# Patient Record
Sex: Female | Born: 1975 | Race: White | Hispanic: No | Marital: Married | Smoking: Current every day smoker
Health system: Southern US, Community
[De-identification: ages and names within clinical notes are randomized; demographics above are authoritative.]

## PROBLEM LIST (undated history)

## (undated) DIAGNOSIS — M51369 Other intervertebral disc degeneration, lumbar region without mention of lumbar back pain or lower extremity pain: Secondary | ICD-10-CM

## (undated) DIAGNOSIS — E282 Polycystic ovarian syndrome: Secondary | ICD-10-CM

## (undated) DIAGNOSIS — K589 Irritable bowel syndrome without diarrhea: Secondary | ICD-10-CM

## (undated) DIAGNOSIS — M419 Scoliosis, unspecified: Secondary | ICD-10-CM

## (undated) DIAGNOSIS — K760 Fatty (change of) liver, not elsewhere classified: Secondary | ICD-10-CM

## (undated) DIAGNOSIS — F419 Anxiety disorder, unspecified: Secondary | ICD-10-CM

## (undated) DIAGNOSIS — F609 Personality disorder, unspecified: Secondary | ICD-10-CM

## (undated) DIAGNOSIS — K219 Gastro-esophageal reflux disease without esophagitis: Secondary | ICD-10-CM

## (undated) DIAGNOSIS — R197 Diarrhea, unspecified: Secondary | ICD-10-CM

## (undated) DIAGNOSIS — R1013 Epigastric pain: Secondary | ICD-10-CM

## (undated) DIAGNOSIS — M5136 Other intervertebral disc degeneration, lumbar region: Secondary | ICD-10-CM

## (undated) HISTORY — DX: Personality disorder, unspecified: F60.9

## (undated) HISTORY — DX: Diarrhea, unspecified: R19.7

## (undated) HISTORY — DX: Fatty (change of) liver, not elsewhere classified: K76.0

## (undated) HISTORY — PX: INCISION AND DRAINAGE PERIRECTAL ABSCESS: SHX1804

## (undated) HISTORY — DX: Other intervertebral disc degeneration, lumbar region without mention of lumbar back pain or lower extremity pain: M51.369

## (undated) HISTORY — DX: Gastro-esophageal reflux disease without esophagitis: K21.9

## (undated) HISTORY — PX: DILATION AND CURETTAGE OF UTERUS: SHX78

## (undated) HISTORY — DX: Anxiety disorder, unspecified: F41.9

## (undated) HISTORY — DX: Irritable bowel syndrome, unspecified: K58.9

## (undated) HISTORY — DX: Scoliosis, unspecified: M41.9

## (undated) HISTORY — PX: CHOLECYSTECTOMY: SHX55

## (undated) HISTORY — DX: Other intervertebral disc degeneration, lumbar region: M51.36

## (undated) HISTORY — PX: CERVICAL BIOPSY  W/ LOOP ELECTRODE EXCISION: SUR135

## (undated) HISTORY — DX: Epigastric pain: R10.13

---

## 2002-05-07 ENCOUNTER — Emergency Department (HOSPITAL_COMMUNITY): Admission: EM | Admit: 2002-05-07 | Discharge: 2002-05-07 | Payer: Self-pay | Admitting: Emergency Medicine

## 2002-05-09 ENCOUNTER — Emergency Department (HOSPITAL_COMMUNITY): Admission: EM | Admit: 2002-05-09 | Discharge: 2002-05-09 | Payer: Self-pay | Admitting: Emergency Medicine

## 2002-05-31 ENCOUNTER — Emergency Department (HOSPITAL_COMMUNITY): Admission: EM | Admit: 2002-05-31 | Discharge: 2002-05-31 | Payer: Self-pay | Admitting: Emergency Medicine

## 2002-08-01 ENCOUNTER — Emergency Department (HOSPITAL_COMMUNITY): Admission: EM | Admit: 2002-08-01 | Discharge: 2002-08-01 | Payer: Self-pay | Admitting: Emergency Medicine

## 2002-09-01 ENCOUNTER — Emergency Department (HOSPITAL_COMMUNITY): Admission: EM | Admit: 2002-09-01 | Discharge: 2002-09-01 | Payer: Self-pay | Admitting: *Deleted

## 2002-09-25 ENCOUNTER — Encounter: Admission: RE | Admit: 2002-09-25 | Discharge: 2002-09-25 | Payer: Self-pay | Admitting: Infectious Diseases

## 2002-10-01 ENCOUNTER — Encounter: Admission: RE | Admit: 2002-10-01 | Discharge: 2002-10-01 | Payer: Self-pay | Admitting: Infectious Diseases

## 2003-01-23 ENCOUNTER — Emergency Department (HOSPITAL_COMMUNITY): Admission: EM | Admit: 2003-01-23 | Discharge: 2003-01-23 | Payer: Self-pay | Admitting: Emergency Medicine

## 2006-03-11 ENCOUNTER — Emergency Department: Payer: Self-pay | Admitting: Emergency Medicine

## 2006-04-18 ENCOUNTER — Emergency Department (HOSPITAL_COMMUNITY): Admission: EM | Admit: 2006-04-18 | Discharge: 2006-04-18 | Payer: Self-pay | Admitting: Emergency Medicine

## 2006-04-21 ENCOUNTER — Emergency Department (HOSPITAL_COMMUNITY): Admission: EM | Admit: 2006-04-21 | Discharge: 2006-04-21 | Payer: Self-pay | Admitting: Emergency Medicine

## 2007-03-20 ENCOUNTER — Emergency Department (HOSPITAL_COMMUNITY): Admission: EM | Admit: 2007-03-20 | Discharge: 2007-03-20 | Payer: Self-pay | Admitting: Emergency Medicine

## 2007-05-03 ENCOUNTER — Emergency Department (HOSPITAL_COMMUNITY): Admission: EM | Admit: 2007-05-03 | Discharge: 2007-05-03 | Payer: Self-pay | Admitting: Family Medicine

## 2007-05-06 ENCOUNTER — Emergency Department (HOSPITAL_COMMUNITY): Admission: EM | Admit: 2007-05-06 | Discharge: 2007-05-06 | Payer: Self-pay | Admitting: Emergency Medicine

## 2007-07-23 ENCOUNTER — Emergency Department (HOSPITAL_COMMUNITY): Admission: EM | Admit: 2007-07-23 | Discharge: 2007-07-23 | Payer: Self-pay | Admitting: Emergency Medicine

## 2007-08-23 ENCOUNTER — Inpatient Hospital Stay (HOSPITAL_COMMUNITY): Admission: EM | Admit: 2007-08-23 | Discharge: 2007-08-23 | Payer: Self-pay | Admitting: Emergency Medicine

## 2008-07-11 ENCOUNTER — Emergency Department (HOSPITAL_COMMUNITY): Admission: EM | Admit: 2008-07-11 | Discharge: 2008-07-11 | Payer: Self-pay | Admitting: Emergency Medicine

## 2009-06-21 ENCOUNTER — Emergency Department (HOSPITAL_COMMUNITY): Admission: EM | Admit: 2009-06-21 | Discharge: 2009-06-21 | Payer: Self-pay | Admitting: Emergency Medicine

## 2010-08-20 LAB — URINE MICROSCOPIC-ADD ON

## 2010-08-20 LAB — URINALYSIS, ROUTINE W REFLEX MICROSCOPIC
Protein, ur: NEGATIVE mg/dL
Specific Gravity, Urine: 1.033 — ABNORMAL HIGH (ref 1.005–1.030)
pH: 5.5 (ref 5.0–8.0)

## 2010-09-22 NOTE — Op Note (Signed)
Maureen Underwood, Maureen Underwood              ACCOUNT NO.:  [id number]   MEDICAL RECORD NO.:  [id number]          PATIENT TYPE:  INP   LOCATION:  2550                         FACILITY:  [facility]   PHYSICIAN:  Anselm Pancoast. Weatherly, M.D.DATE OF BIRTH:  06/26/1975   DATE OF PROCEDURE:  08/23/2007  DATE OF DISCHARGE:  08/23/2007                               OPERATIVE REPORT   PREOPERATIVE DIAGNOSES:  1. Persistent recurrent perirectal abscess.  2. History of previous methicillin-resistant Staphylococcus aureus      abscesses.   OPERATION:  Debridement drainage of large superficial posterior  perirectal abscess.   ANESTHESIA:  General anesthesia in lithotomy position   Dictator canceled this dictation.           ______________________________  Anselm Pancoast. Zachery Dakins, M.D.     WJW/MEDQ  D:  08/23/2007  T:  08/24/2007  Job:  [id number]

## 2010-09-22 NOTE — Op Note (Signed)
NAMETERRIL, AMARO              ACCOUNT NO.:  [id number]   MEDICAL RECORD NO.:  [id number]          PATIENT TYPE:  INP   LOCATION:  2550                         FACILITY:  [facility]   PHYSICIAN:  Anselm Pancoast. Weatherly, M.D.DATE OF BIRTH:  [birth year]   DATE OF PROCEDURE:  DATE OF DISCHARGE:  08/23/2007                               OPERATIVE REPORT   PREOPERATIVE DIAGNOSIS:  Perirectal abscess, posterior left.   POSTOPERATIVE DIAGNOSES:  Perirectal abscess, this is possibly a  methicillin-resistant Staphylococcus aureus abscess, as no communication  into the anal sphincter was identified.   OPERATION:  Examinations and drainage debridement of large pseudo  superficial posterior perirectal abscess.   ANESTHESIA:  General, lithotomy position.   HISTORY:  Maureen Underwood is a 35 year old female who came to the  emergency room today for about 2 week progressive history of pain and  swelling in the perianal area.  She has had previous MRSA infection  within the last year.  She is allergic to doxycycline and because of the  increase pain and swelling, she went to the emergency room in  [place] approximately a week ago where the lower area was drained in  the operative room, and she was given a prescription for Augmentin with  the cough, so the Augmentin and pain medication.  The medication was not  received, and she so changed the packing as instructed and felt better  for approximately 2-3 days, and then over the last 2-3 days has had  increase swelling, tenderness, and redness.  She came to the emergency  room today and on examination, she has a fairly marked erythematous  fluctuant area posterior not really midline, but to the left a little  bit more away from the anal sphincter than the typical perirectal  abscess.  I did a rectal exam in the emergency room and previously there  is a fairly thick area between the exam, and I think where the anus and  the superficial abscess with a  marked erythema and, etc., and I thought  that this would be best managed in the operating room is to try and  attempt with local anesthesia in the ER.  The patient was in agreement  with this and she was given a gram of vancomycin intravenously.  She was  induced to general anesthesia in the tracheal and then we placed her up  in the lithotomy position with the legs padded.  Then, I put her on old  Trendelenburg position and then did a rectal exam and cannot feel any  definite fluctuation with her relaxed, prepped the perianal area with  Betadine solution and draped her with ligans and towels.  First I used  an anoscope into the anus and by pressing on the abscess, which we feel  the fluctuation could not see any evidence of drainage going into the  actual anus, and I see no internal fistula on the posterior area  midline.  Next, the area of fluctuant was about probably 3 cm or maybe 3-  1/2 from the sphincter area that was obviously an area of fluctuant, and  I opened  this up with a little triangle area of the skin, cultured it  aerobic and anaerobically, and it was probably having some half of frank  pus.  The area has several little pockets, kind of extending more  superficial, not going deep and with a hemostat, etc., I cannot find any  communication actually truly going into the posterior sphincter area,  but there is some fairly marked abscess more superficial.  All the  necrotic and infected tissue was removed to back to good clean normal  appearing tissue that cauterization was used for hemostasis for couple  of little vessels were sutured with 3-0 Chromic.  After the area was  completely broken out the abscess, and I then used a 4x4 that have been  soaked in Betadine solution and placed it into the little opening.  The  packing should fall out easily when soaked it in the tub.  The 4x4 was  replaced with stretch bands over this and the procedure terminated.  The  patient was sent  to the recovery room extubated, breathing  spontaneously, and whether she will go home this evening or spent the  night go home in the morning will be  determined in the recovery room.  She will made followup in the office  in approximately 3-4 days, and I will place her on Septra with the  history of doxycycline and history of previous MRSA, though I would not  be surprised if this a MRSA abscess and not truly a perirectal abscess.           ______________________________  Anselm Pancoast. Zachery Dakins, M.D.     WJW/MEDQ  D:  08/23/2007  T:  08/24/2007  Job:  [id number]

## 2010-09-22 NOTE — H&P (Signed)
NAMESHARA, HARTIS              ACCOUNT NO.:  [id number]   MEDICAL RECORD NO.:  [id number]          PATIENT TYPE:  INP   LOCATION:  2550                         FACILITY:  [facility]   PHYSICIAN:  Anselm Pancoast. Weatherly, M.D.DATE OF BIRTH:  [birth year]   DATE OF ADMISSION:  08/23/2007  DATE OF DISCHARGE:  08/23/2007                              HISTORY & PHYSICAL   HISTORY:  Recurrent or persistent perirectal abscess.  History of  previous MRSA abscesses.  Maureen Underwood is a 35 year old single  Caucasian female who came to the emergency room today with the following  history.  She said approximately 10 maybe 14 days ago, she started  developing pain around the perianal area posterior.  After approximately  4-5 days, she went to the emergency room in [place] where the lower  area was drained, and she was placed on Augmentin and pain medication.  She did not get the medication because of the expense.  After 4-5 days,  at first, she felt better and then felt worse.  She had some oral Septra  leftover from her previous problems with MRSA that she started taking  for approximately 2 days.  She was no longer having any drainage, but  there was significant swelling in the lower area where it had been I&Ded  and closed up and then with increase in pain over the last 48 hours, she  came to the emergency room here.  She has no past history of chronic  medical problems with exception that she has had 2 MRSA abscesses.  She  says she is allergic to doxycycline, it causes hives and she is on no  chronic medications.  She has not had any other procedures except for  little drainage of the abscesses.  She works as a Scientist, physiological I think  in a motel and she is accompanied by her fiance.   FAMILY HISTORY:  Noncontributory.   PHYSICAL EXAMINATION:  VITAL SIGNS:  She is a pleasant, slightly  overweight Caucasian female who is very uncomfortable with a large  abscess around the anus predominantly on  the left posterior quadrant.  On examination in the emergency room, she was very tender on rectal exam  and you can feel an obvious fluctuant area, and I think because of the  cellulitis, they are trying to drain this with adequate general  anesthesia, would not be possible.  HEENT:  Eyes, ears, nose, and throat are unremarkable.  LUNGS:  Clear.  CARDIAC:  Normal sinus rhythm.  ABDOMEN:  Flat, nontender, no problems.  No history of chronic diarrhea.  RECTAL EXAM:  She is in kind of lot of spasm, but when a finger into the  anal canal, you cannot actually feel any fluctuation, but there is  definite real fullness where the area is swollen and erythematous.  LOWER EXTREMITIES:  Unremarkable.  CNS:  Physiologic.   ADMISSION IMPRESSION:  Perirectal abscess posterior left with a history  of previous methicillin-resistant Staphylococcus aureus abscesses of the  abdominal area.   PLAN:  The patient would start on vancomycin intravenously, and we will  take her to the  operating room.  I understand her white count is not  elevated, and she was afebrile when presented here.           ______________________________  Anselm Pancoast. Zachery Dakins, M.D.     WJW/MEDQ  D:  08/23/2007  T:  08/24/2007  Job:  [id number]

## 2011-02-02 LAB — CBC
HCT: 45.8
Hemoglobin: 15.9 — ABNORMAL HIGH
MCHC: 34.8
MCV: 93.7
Platelets: 245
RBC: 4.89
WBC: 12.2 — ABNORMAL HIGH

## 2011-02-02 LAB — POCT I-STAT, CHEM 8
Calcium, Ion: 1.21
Chloride: 106
Creatinine, Ser: 0.8
HCT: 47 — ABNORMAL HIGH
Hemoglobin: 16 — ABNORMAL HIGH
Potassium: 3.8
Sodium: 138
TCO2: 24

## 2011-02-02 LAB — ANAEROBIC CULTURE

## 2011-02-02 LAB — DIFFERENTIAL
Basophils Absolute: 0
Lymphs Abs: 2.3
Neutro Abs: 8.8 — ABNORMAL HIGH
Neutrophils Relative %: 73

## 2011-02-02 LAB — CULTURE, ROUTINE-ABSCESS

## 2011-02-02 LAB — POCT PREGNANCY, URINE: Operator id: 282201

## 2011-02-12 LAB — CULTURE, ROUTINE-ABSCESS

## 2011-02-12 LAB — POCT PREGNANCY, URINE
Operator id: 26520
Preg Test, Ur: NEGATIVE

## 2011-02-16 LAB — I-STAT 8, (EC8 V) (CONVERTED LAB)
Acid-base deficit: 2
BUN: 9
Bicarbonate: 22
Glucose, Bld: 88
HCT: 48 — ABNORMAL HIGH
Potassium: 4.1
pCO2, Ven: 35.1 — ABNORMAL LOW

## 2011-02-16 LAB — URINE MICROSCOPIC-ADD ON

## 2011-02-16 LAB — URINALYSIS, ROUTINE W REFLEX MICROSCOPIC
Bilirubin Urine: NEGATIVE
Glucose, UA: NEGATIVE
Hgb urine dipstick: NEGATIVE
Ketones, ur: 15 — AB
Nitrite: NEGATIVE
Protein, ur: NEGATIVE
Urobilinogen, UA: 0.2

## 2011-02-16 LAB — PREGNANCY, URINE: Preg Test, Ur: NEGATIVE

## 2011-02-16 LAB — DIFFERENTIAL
Basophils Relative: 1
Eosinophils Absolute: 0.3
Monocytes Relative: 8
Neutro Abs: 4.3
Neutrophils Relative %: 53

## 2011-02-16 LAB — POCT I-STAT CREATININE: Creatinine, Ser: 0.9

## 2011-02-16 LAB — CBC
Hemoglobin: 15.6 — ABNORMAL HIGH
Platelets: 278
RDW: 12.2
WBC: 8.1

## 2014-10-23 ENCOUNTER — Encounter: Payer: Self-pay | Admitting: Internal Medicine

## 2014-10-23 ENCOUNTER — Ambulatory Visit: Payer: Self-pay | Attending: Internal Medicine | Admitting: Internal Medicine

## 2014-10-23 VITALS — BP 136/86 | HR 98 | Temp 98.0°F | Resp 18 | Ht 66.0 in | Wt 210.8 lb

## 2014-10-23 DIAGNOSIS — R1084 Generalized abdominal pain: Secondary | ICD-10-CM

## 2014-10-23 DIAGNOSIS — R197 Diarrhea, unspecified: Secondary | ICD-10-CM

## 2014-10-23 DIAGNOSIS — M419 Scoliosis, unspecified: Secondary | ICD-10-CM

## 2014-10-23 DIAGNOSIS — Z205 Contact with and (suspected) exposure to viral hepatitis: Secondary | ICD-10-CM

## 2014-10-23 DIAGNOSIS — K219 Gastro-esophageal reflux disease without esophagitis: Secondary | ICD-10-CM

## 2014-10-23 DIAGNOSIS — M546 Pain in thoracic spine: Secondary | ICD-10-CM

## 2014-10-23 LAB — HEPATITIS C ANTIBODY: HCV AB: NEGATIVE

## 2014-10-23 MED ORDER — TRAMADOL HCL 50 MG PO TABS: 50.0000 mg | ORAL_TABLET | Freq: Two times a day (BID) | ORAL | Status: DC | PRN

## 2014-10-23 MED ORDER — OMEPRAZOLE 20 MG PO CPDR: 20.0000 mg | DELAYED_RELEASE_CAPSULE | Freq: Every day | ORAL | Status: DC

## 2014-10-23 NOTE — Progress Notes (Signed)
Patient ID: Maureen Underwood, female   DOB: 11-Feb-1976, 39 y.o.   MRN: [id number]  WNI:[id number]  FGH:[id number]  DOB - November 01, 1975  CC:  Chief Complaint  Patient presents with  . New Patient    Hep C  . Diarrhea  . Facial Hair  . Panic Attack       HPI: Maureen Underwood is a 39 y.o. female here today to establish medical care.  Patient has a past medical history of scoliosis.  She presents with several complaints today. She states that her husband has been diagnosed with Hepatitis C and she would like to be tested for it today. Her husband was recently diagnosed in April by Dr. Jarold Song and has since been to GI in [place]. She has been told by her husband's doctor to come get tested for Hepatitis C. Her husband has a history of IV drug use, she personally denies any drug use or alcohol use. She denies fevers, chills, myalgias, flu like symptoms, or jaundice.   2. Scoliosis. She states that she was diagnosed with scoliosis in 61 in [place]. Lower back pain that is aggravated by standing for long periods of time. She has to constantly shift her weight off her hips due to the pain. She also has pain in her right shoulder. She has tried advil and aleve for pain. Pain described as a throbbing and stabbing pain. No past back injuries. Denies bowel or bladder dysfunction.  3. Diarrhea with reflux. Reports that she has diarrhea around 5 minutes after eating. Stool is usually loose but sometimes is watery. Feels like the diarrhea has gotten worse in the past 1 year. Overall has been a problem for 3 years. She states that she has this problem with all foods. Admits to stomach cramping and sharp pains in her LLQ. Denies bloody stools, nausea, vomiting. Reflux with heavy spicy foods that happens most days.   Patient has No headache, No chest pain, No Nausea, No new weakness tingling or numbness, No Cough - SOB.  Allergies  Allergen Reactions  . Penicillins Hives   History reviewed. No pertinent past  medical history. No current outpatient prescriptions on file prior to visit.   No current facility-administered medications on file prior to visit.   History reviewed. No pertinent family history. History   Social History  . Marital Status: Married    Spouse Name: N/A  . Number of Children: N/A  . Years of Education: N/A   Occupational History  . Not on file.   Social History Main Topics  . Smoking status: Not on file  . Smokeless tobacco: Not on file  . Alcohol Use: Not on file  . Drug Use: Not on file  . Sexual Activity: Not on file   Other Topics Concern  . Not on file   Social History Narrative  . No narrative on file    Review of Systems: See HPI    Objective:   Filed Vitals:   10/23/14 1041  BP: 136/86  Pulse: 98  Temp: 98 F (36.7 C)  Resp: 18    Physical Exam  Constitutional: She is oriented to person, place, and time.  Neck: No thyromegaly present.  Cardiovascular: Normal rate, regular rhythm and normal heart sounds.   Pulmonary/Chest: Effort normal and breath sounds normal.  Abdominal: Soft. Bowel sounds are normal. There is tenderness (generalized. ).  Musculoskeletal: She exhibits no edema.  Noticeable scoliosis   Neurological: She is alert and oriented to person, place, and time.  Skin: Skin is warm and dry.  Psychiatric: She has a normal mood and affect.     Lab Results  Component Value Date   WBC 12.2* 08/23/2007   HGB 16.0* 08/23/2007   HCT 47.0* 08/23/2007   MCV 93.7 08/23/2007   PLT 245 08/23/2007   Lab Results  Component Value Date   CREATININE 0.8 08/23/2007   BUN 16 08/23/2007   NA 138 08/23/2007   K 3.8 08/23/2007   CL 106 08/23/2007    No results found for: HGBA1C Lipid Panel  No results found for: CHOL, TRIG, HDL, CHOLHDL, VLDL, LDLCALC     Assessment and plan:   Maureen Underwood was seen today for new patient, diarrhea, facial hair and panic attack.  Diagnoses and all orders for this visit:  Exposure to hepatitis  C Orders: -     Hepatitis C Antibody  Scoliosis Will refer to Ortho once she gets orange card   Bilateral thoracic back pain Orders: -     traMADol (ULTRAM) 50 MG tablet; Take 1 tablet (50 mg total) by mouth every 12 (twelve) hours as needed. Due to scoliosis   Generalized abdominal pain LLQ was more tender than other locations. Patient will need GI to r/o IBS versus Diverticulitis   Diarrhea See above   Gastroesophageal reflux disease without esophagitis Orders: -     Begin omeprazole (PRILOSEC) 20 MG capsule; Take 1 capsule (20 mg total) by mouth daily before breakfast. Discussed diet and weight with patient relating to acid reflux.  Went over things that may exacerbate acid reflux such as tomatoes, spicy foods, coffee, carbonated beverages, chocolates, etc.  Advised patient to avoid laying down at least two hours after meals and sleep with HOB elevated.    Return if symptoms worsen or fail to improve.    Chari Manning, [facility] [phone] 10/23/2014, 10:49 AM

## 2014-10-23 NOTE — Progress Notes (Signed)
Patient ID: Maureen Underwood, female   DOB: 08/06/1975, 39 y.o.   MRN: [id number]  New patient here to established care. Pt states she her husband  Has Hep C and she would like to get check. She c/o diarrhea and reflux.

## 2014-10-23 NOTE — Patient Instructions (Signed)
Gastroesophageal Reflux Disease, Adult Gastroesophageal reflux disease (GERD) happens when acid from your stomach flows up into the esophagus. When acid comes in contact with the esophagus, the acid causes soreness (inflammation) in the esophagus. Over time, GERD may create small holes (ulcers) in the lining of the esophagus. CAUSES   Increased body weight. This puts pressure on the stomach, making acid rise from the stomach into the esophagus.  Smoking. This increases acid production in the stomach.  Drinking alcohol. This causes decreased pressure in the lower esophageal sphincter (valve or ring of muscle between the esophagus and stomach), allowing acid from the stomach into the esophagus.  Late evening meals and a full stomach. This increases pressure and acid production in the stomach.  A malformed lower esophageal sphincter. Sometimes, no cause is found. SYMPTOMS   Burning pain in the lower part of the mid-chest behind the breastbone and in the mid-stomach area. This may occur twice a week or more often.  Trouble swallowing.  Sore throat.  Dry cough.  Asthma-like symptoms including chest tightness, shortness of breath, or wheezing. DIAGNOSIS  Your caregiver may be able to diagnose GERD based on your symptoms. In some cases, X-rays and other tests may be done to check for complications or to check the condition of your stomach and esophagus. TREATMENT  Your caregiver may recommend over-the-counter or prescription medicines to help decrease acid production. Ask your caregiver before starting or adding any new medicines.  HOME CARE INSTRUCTIONS   Change the factors that you can control. Ask your caregiver for guidance concerning weight loss, quitting smoking, and alcohol consumption.  Avoid foods and drinks that make your symptoms worse, such as:  Caffeine or alcoholic drinks.  Chocolate.  Peppermint or mint flavorings.  Garlic and onions.  Spicy foods.  Citrus fruits,  such as oranges, lemons, or limes.  Tomato-based foods such as sauce, chili, salsa, and pizza.  Fried and fatty foods.  Avoid lying down for the 3 hours prior to your bedtime or prior to taking a nap.  Eat small, frequent meals instead of large meals.  Wear loose-fitting clothing. Do not wear anything tight around your waist that causes pressure on your stomach.  Raise the head of your bed 6 to 8 inches with wood blocks to help you sleep. Extra pillows will not help.  Only take over-the-counter or prescription medicines for pain, discomfort, or fever as directed by your caregiver.  Do not take aspirin, ibuprofen, or other nonsteroidal anti-inflammatory drugs (NSAIDs). SEEK IMMEDIATE MEDICAL CARE IF:   You have pain in your arms, neck, jaw, teeth, or back.  Your pain increases or changes in intensity or duration.  You develop nausea, vomiting, or sweating (diaphoresis).  You develop shortness of breath, or you faint.  Your vomit is green, yellow, black, or looks like coffee grounds or blood.  Your stool is red, bloody, or black. These symptoms could be signs of other problems, such as heart disease, gastric bleeding, or esophageal bleeding. MAKE SURE YOU:   Understand these instructions.  Will watch your condition.  Will get help right away if you are not doing well or get worse. Document Released: 02/03/2005 Document Revised: 07/19/2011 Document Reviewed: 11/13/2010 ExitCare Patient Information 2015 [employer]. This information is not intended to replace advice given to you by your health care provider. Make sure you discuss any questions you have with your health care provider.  

## 2014-10-24 ENCOUNTER — Telehealth: Payer: Self-pay | Admitting: Internal Medicine

## 2014-10-24 NOTE — Telephone Encounter (Signed)
Pt is returning phone call to obtain her results. Please follow up with patient. Thank you.

## 2014-10-28 ENCOUNTER — Telehealth: Payer: Self-pay | Admitting: Internal Medicine

## 2014-10-28 NOTE — Telephone Encounter (Signed)
Patient called to request a referral to the [facility]. Please f/u with pt.

## 2014-10-29 ENCOUNTER — Other Ambulatory Visit: Payer: Self-pay | Admitting: Internal Medicine

## 2014-10-29 DIAGNOSIS — R197 Diarrhea, unspecified: Secondary | ICD-10-CM

## 2014-10-29 DIAGNOSIS — M419 Scoliosis, unspecified: Secondary | ICD-10-CM

## 2014-10-29 NOTE — Telephone Encounter (Signed)
Please call patient and let her know this has been done.

## 2014-10-29 NOTE — Telephone Encounter (Signed)
Pt notified of referral place.

## 2014-11-21 ENCOUNTER — Ambulatory Visit: Payer: Self-pay | Attending: Internal Medicine

## 2014-11-27 ENCOUNTER — Encounter: Payer: Self-pay | Admitting: Internal Medicine

## 2014-11-27 ENCOUNTER — Ambulatory Visit: Payer: Self-pay | Attending: Internal Medicine | Admitting: Internal Medicine

## 2014-11-27 VITALS — BP 129/84 | HR 70 | Temp 98.6°F | Resp 16 | Ht 66.0 in | Wt 212.4 lb

## 2014-11-27 DIAGNOSIS — F32A Depression, unspecified: Secondary | ICD-10-CM | POA: Insufficient documentation

## 2014-11-27 DIAGNOSIS — F419 Anxiety disorder, unspecified: Secondary | ICD-10-CM | POA: Insufficient documentation

## 2014-11-27 DIAGNOSIS — L0291 Cutaneous abscess, unspecified: Secondary | ICD-10-CM | POA: Insufficient documentation

## 2014-11-27 DIAGNOSIS — F329 Major depressive disorder, single episode, unspecified: Secondary | ICD-10-CM | POA: Insufficient documentation

## 2014-11-27 MED ORDER — SULFAMETHOXAZOLE-TRIMETHOPRIM 800-160 MG PO TABS: 1.0000 | ORAL_TABLET | Freq: Two times a day (BID) | ORAL | Status: DC

## 2014-11-27 MED ORDER — SERTRALINE HCL 50 MG PO TABS: 50.0000 mg | ORAL_TABLET | Freq: Every day | ORAL | Status: DC

## 2014-11-27 NOTE — Patient Instructions (Addendum)
When you get approved call me and I will place referral for GYN, Ortho, and GI  I have started you on zoloft for anxiety and depression. We will follow up in 6-8 weeks to see how the medication is working  Next visit we can do pap and breast exam

## 2014-11-27 NOTE — Progress Notes (Signed)
Patient in today to f/u on panic attacks, lack of menstrual cycle, has cyst on groin area.

## 2014-11-27 NOTE — Progress Notes (Signed)
Patient ID: Maureen Underwood, female   DOB: [birth year], 39 y.o.   MRN: [id number]  CC: panic attacks, cyst, irregular cycle  HPI: Maureen Underwood is a 39 y.o. female here today for a follow up visit.  Patient has past medical history of anxiety and GERD. Patient reports that she has been having more difficulty with feeling anxious and depressed. She has no history of depression. She states that she feels mad often, having crying spells, and is always on edge. Her PHQ-9 score was 18 and her GAD-7 score is 18.   She reports that is 2005 she had a hysteroscopy and D & C. Since then she has been having concerns with irregular menstrual cycles. She reports having a cycle once every 2-3 years which only last 1 day at a time. She is now concerned because she is having painful intercourse and light spotting after masturbation.  She has also noticed facial hair on chin, upper lip, and back for past 2-3 years as well. She denies a history of PCOS.   She is concerned about a abscess formation in her groin. She reports having multiple abscesses in the past that have been I&D due to MRSA infections. She uses Dial antimicrobial soap daily.    Allergies  Allergen Reactions  . Penicillins Hives   Past Medical History  Diagnosis Date  . Anxiety   . GERD (gastroesophageal reflux disease)    Current Outpatient Prescriptions on File Prior to Visit  Medication Sig Dispense Refill  . omeprazole (PRILOSEC) 20 MG capsule Take 1 capsule (20 mg total) by mouth daily before breakfast. 30 capsule 3  . traMADol (ULTRAM) 50 MG tablet Take 1 tablet (50 mg total) by mouth every 12 (twelve) hours as needed. 30 tablet 0   No current facility-administered medications on file prior to visit.   Family History  Problem Relation Age of Onset  . Diabetes Mother   . Heart disease Paternal Grandfather   . Hypertension Paternal Grandfather   . Cancer Paternal Grandfather    History   Social History  . Marital Status:  Married    Spouse Name: N/A  . Number of Children: N/A  . Years of Education: N/A   Occupational History  . Not on file.   Social History Main Topics  . Smoking status: Current Every Day Smoker  . Smokeless tobacco: Never Used  . Alcohol Use: Not on file  . Drug Use: Not on file  . Sexual Activity: Not on file   Other Topics Concern  . Not on file   Social History Narrative    Review of Systems: See HPI    Objective:   Filed Vitals:   11/27/14 1421  BP: 129/84  Pulse: 70  Temp: 98.6 F (37 C)  Resp: 16    Physical Exam  Constitutional: She is oriented to person, place, and time.  Cardiovascular: Normal rate, regular rhythm and normal heart sounds.   Pulmonary/Chest: Effort normal and breath sounds normal.  Abdominal: Soft. Bowel sounds are normal. She exhibits no distension. There is no tenderness.  Genitourinary:     None draining abcess  Neurological: She is alert and oriented to person, place, and time.     Lab Results  Component Value Date   WBC 12.2* 08/23/2007   HGB 16.0* 08/23/2007   HCT 47.0* 08/23/2007   MCV 93.7 08/23/2007   PLT 245 08/23/2007   Lab Results  Component Value Date   CREATININE 0.8 08/23/2007   BUN 16  08/23/2007   NA 138 08/23/2007   K 3.8 08/23/2007   CL 106 08/23/2007    No results found for: HGBA1C Lipid Panel  No results found for: CHOL, TRIG, HDL, CHOLHDL, VLDL, LDLCALC     Assessment and plan:   Diagnoses and all orders for this visit:  Abscess Orders: -     sulfamethoxazole-trimethoprim (BACTRIM DS,SEPTRA DS) 800-160 MG per tablet; Take 1 tablet by mouth 2 (two) times daily. Use warm compresses and Dial antimicrobial soap   Anxiety Orders: -   Begin sertraline (ZOLOFT) 50 MG tablet; Take 1 tablet (50 mg total) by mouth daily. Will follow up in 6-8 weeks to see if she notices any improvement in symptoms   Depression PHQ-9 of 18 GAD-7 of 18 See above   Return for pap next week and 6-8 weeks for  depression .   Lance Bosch, [facility] [phone] 11/27/2014, 2:55 PM

## 2014-11-28 ENCOUNTER — Telehealth: Payer: Self-pay | Admitting: Clinical

## 2014-11-28 ENCOUNTER — Telehealth: Payer: Self-pay | Admitting: Internal Medicine

## 2014-11-28 NOTE — Telephone Encounter (Signed)
Left HIPPA-compliant message to return call at [phone]

## 2014-11-28 NOTE — Telephone Encounter (Signed)
Pt returns call, says she will come in to talk to [facility] at next PCP visit, or earlier, as needed.

## 2014-11-28 NOTE — Telephone Encounter (Signed)
Patient called back please f/u

## 2014-12-04 ENCOUNTER — Ambulatory Visit: Payer: Self-pay | Attending: Internal Medicine | Admitting: Internal Medicine

## 2014-12-04 ENCOUNTER — Encounter: Payer: Self-pay | Admitting: Internal Medicine

## 2014-12-04 VITALS — BP 112/78 | HR 74 | Temp 98.4°F | Resp 16 | Ht 66.0 in | Wt 210.0 lb

## 2014-12-04 DIAGNOSIS — Z124 Encounter for screening for malignant neoplasm of cervix: Secondary | ICD-10-CM

## 2014-12-04 LAB — CBC
HEMATOCRIT: 45.6 % (ref 36.0–46.0)
HEMOGLOBIN: 16.2 g/dL — AB (ref 12.0–15.0)
MCH: 33 pg (ref 26.0–34.0)
MCHC: 35.5 g/dL (ref 30.0–36.0)
MCV: 92.9 fL (ref 78.0–100.0)
MPV: 11 fL (ref 8.6–12.4)
Platelets: 285 10*3/uL (ref 150–400)
RBC: 4.91 MIL/uL (ref 3.87–5.11)
RDW: 13.1 % (ref 11.5–15.5)
WBC: 9 10*3/uL (ref 4.0–10.5)

## 2014-12-04 LAB — COMPLETE METABOLIC PANEL WITH GFR
ALBUMIN: 4.4 g/dL (ref 3.6–5.1)
ALK PHOS: 74 U/L (ref 33–115)
ALT: 35 U/L — AB (ref 6–29)
AST: 22 U/L (ref 10–30)
BUN: 13 mg/dL (ref 7–25)
CO2: 23 meq/L (ref 20–31)
CREATININE: 1.11 mg/dL — AB (ref 0.50–1.10)
Calcium: 9.5 mg/dL (ref 8.6–10.2)
Chloride: 104 mEq/L (ref 98–110)
GFR, EST AFRICAN AMERICAN: 74 mL/min (ref >=60)
GFR, EST NON AFRICAN AMERICAN: 64 mL/min (ref >=60)
GLUCOSE: 84 mg/dL (ref 65–99)
Potassium: 4.5 mEq/L (ref 3.5–5.3)
Sodium: 139 mEq/L (ref 135–146)
Total Bilirubin: 0.5 mg/dL (ref 0.2–1.2)
Total Protein: 7 g/dL (ref 6.1–8.1)

## 2014-12-04 LAB — LIPID PANEL
CHOL/HDL RATIO: 7.2 ratio — AB (ref <=5.0)
CHOLESTEROL: 210 mg/dL — AB (ref 125–200)
HDL: 29 mg/dL — ABNORMAL LOW (ref >=46)
LDL Cholesterol: 151 mg/dL — ABNORMAL HIGH (ref <130)
Triglycerides: 148 mg/dL (ref <150)
VLDL: 30 mg/dL (ref <30)

## 2014-12-04 LAB — TSH: TSH: 1.678 u[IU]/mL (ref 0.350–4.500)

## 2014-12-04 NOTE — Progress Notes (Signed)
Patient ID: Maureen Underwood, female   DOB: [birth year], 39 y.o.   MRN: [id number]  CC: pap/breast exam   HPI: Maureen Underwood is a 39 y.o. female here today for a follow up visit.  Patient has past medical history of anxiety and GERD. She reports that she needs to have a pap smear because the last pap was in 2005. She c/o of a white vaginal discharge that she has had for several months. She reports that she notices a occasional odor. She denies new sexual partners, lesions, itch. She denies any breast changes.  Patient has No headache, No chest pain, No abdominal pain - No Nausea, No new weakness tingling or numbness, No Cough - SOB.  Allergies  Allergen Reactions  . Penicillins Hives   Past Medical History  Diagnosis Date  . Anxiety   . GERD (gastroesophageal reflux disease)    Current Outpatient Prescriptions on File Prior to Visit  Medication Sig Dispense Refill  . omeprazole (PRILOSEC) 20 MG capsule Take 1 capsule (20 mg total) by mouth daily before breakfast. 30 capsule 3  . sertraline (ZOLOFT) 50 MG tablet Take 1 tablet (50 mg total) by mouth daily. 30 tablet 3  . sulfamethoxazole-trimethoprim (BACTRIM DS,SEPTRA DS) 800-160 MG per tablet Take 1 tablet by mouth 2 (two) times daily. 14 tablet 0  . traMADol (ULTRAM) 50 MG tablet Take 1 tablet (50 mg total) by mouth every 12 (twelve) hours as needed. 30 tablet 0   No current facility-administered medications on file prior to visit.   Family History  Problem Relation Age of Onset  . Diabetes Mother   . Heart disease Paternal Grandfather   . Hypertension Paternal Grandfather   . Cancer Paternal Grandfather    History   Social History  . Marital Status: Married    Spouse Name: N/A  . Number of Children: N/A  . Years of Education: N/A   Occupational History  . Not on file.   Social History Main Topics  . Smoking status: Current Every Day Smoker  . Smokeless tobacco: Never Used  . Alcohol Use: Not on file  . Drug Use: Not  on file  . Sexual Activity: Not on file   Other Topics Concern  . Not on file   Social History Narrative    Review of Systems: See HPI.    Objective:   Filed Vitals:   12/04/14 1123  BP: 112/78  Pulse: 74  Temp: 98.4 F (36.9 C)  Resp: 16    Physical Exam  Pulmonary/Chest: Right breast exhibits no mass. Left breast exhibits no mass.  Genitourinary: Vagina normal and uterus normal. No breast tenderness or discharge. Cervix exhibits no motion tenderness, no discharge and no friability. Right adnexum displays no tenderness. Left adnexum displays no tenderness.  Lymphadenopathy:       Right: No inguinal adenopathy present.       Left: No inguinal adenopathy present.   .  Lab Results  Component Value Date   WBC 12.2* 08/23/2007   HGB 16.0* 08/23/2007   HCT 47.0* 08/23/2007   MCV 93.7 08/23/2007   PLT 245 08/23/2007   Lab Results  Component Value Date   CREATININE 0.8 08/23/2007   BUN 16 08/23/2007   NA 138 08/23/2007   K 3.8 08/23/2007   CL 106 08/23/2007    No results found for: HGBA1C Lipid Panel  No results found for: CHOL, TRIG, HDL, CHOLHDL, VLDL, LDLCALC     Assessment and plan:   There are  no diagnoses linked to this encounter.       Lance Bosch, [facility] [phone] 12/04/2014, 11:41 AM

## 2014-12-04 NOTE — Progress Notes (Signed)
Pap smear last was on 2005 unsure of result  No menses, vaginal discharge-white

## 2014-12-05 LAB — CERVICOVAGINAL ANCILLARY ONLY
Chlamydia: NEGATIVE
NEISSERIA GONORRHEA: NEGATIVE
WET PREP (BD AFFIRM): POSITIVE — AB

## 2014-12-05 LAB — CYTOLOGY - PAP

## 2014-12-05 LAB — HIV ANTIBODY (ROUTINE TESTING W REFLEX): HIV 1&2 Ab, 4th Generation: NONREACTIVE

## 2014-12-05 LAB — RPR

## 2014-12-06 ENCOUNTER — Telehealth: Payer: Self-pay | Admitting: Internal Medicine

## 2014-12-06 DIAGNOSIS — R87613 High grade squamous intraepithelial lesion on cytologic smear of cervix (HGSIL): Secondary | ICD-10-CM | POA: Insufficient documentation

## 2014-12-06 DIAGNOSIS — N760 Acute vaginitis: Principal | ICD-10-CM

## 2014-12-06 DIAGNOSIS — E785 Hyperlipidemia, unspecified: Secondary | ICD-10-CM | POA: Insufficient documentation

## 2014-12-06 DIAGNOSIS — B9689 Other specified bacterial agents as the cause of diseases classified elsewhere: Secondary | ICD-10-CM

## 2014-12-06 MED ORDER — METRONIDAZOLE 500 MG PO TABS: 500.0000 mg | ORAL_TABLET | Freq: Two times a day (BID) | ORAL | Status: DC

## 2014-12-06 NOTE — Telephone Encounter (Signed)
Called patient to inform her of her cytology report. 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 Martin Majestic over usually treatment and next steps with patient. I have explained that she may call back on Monday to see if Alinda Sierras was able to schedule her a appointment with GYN. She was positive for bacterial vaginosis. Will prescribe Flagyl to take twice daily for 7 days with instruction to avoid alcohol during treatment.  I also went over slightly elevated creatinine. She will avoid all NSAID's for now and we will recheck in 2-3 months.  Went over elevated cholesterol and specific diet changes, weight loss, and diet changes. Patient verbalized understanding.

## 2014-12-11 ENCOUNTER — Encounter: Payer: Self-pay | Admitting: Medical

## 2014-12-23 ENCOUNTER — Encounter: Payer: Self-pay | Admitting: Medical

## 2014-12-23 ENCOUNTER — Ambulatory Visit (INDEPENDENT_AMBULATORY_CARE_PROVIDER_SITE_OTHER): Payer: Self-pay | Admitting: Medical

## 2014-12-23 ENCOUNTER — Other Ambulatory Visit (HOSPITAL_COMMUNITY)
Admission: RE | Admit: 2014-12-23 | Discharge: 2014-12-23 | Disposition: A | Discharge status: Home / Self Care | Payer: Self-pay | Source: Ambulatory Visit | Attending: Medical | Admitting: Medical

## 2014-12-23 VITALS — BP 120/81 | HR 86 | Ht 66.0 in | Wt 211.2 lb

## 2014-12-23 DIAGNOSIS — R87619 Unspecified abnormal cytological findings in specimens from cervix uteri: Secondary | ICD-10-CM | POA: Insufficient documentation

## 2014-12-23 DIAGNOSIS — Z01818 Encounter for other preprocedural examination: Secondary | ICD-10-CM

## 2014-12-23 DIAGNOSIS — R87613 High grade squamous intraepithelial lesion on cytologic smear of cervix (HGSIL): Secondary | ICD-10-CM

## 2014-12-23 LAB — POCT PREGNANCY, URINE: Preg Test, Ur: NEGATIVE

## 2014-12-23 NOTE — Progress Notes (Signed)
Patient ID: Maureen Underwood, female   DOB: 01/19/1976, 39 y.o.   MRN: [id number]     GYNECOLOGY CLINIC COLPOSCOPY PROCEDURE NOTE  Ms. Maureen Underwood is a 39 y.o.  here for colposcopy for high-grade squamous intraepithelial neoplasia  (HGSIL-encompassing moderate and severe dysplasia) pap smear on 12/04/14. Discussed role for HPV in cervical dysplasia, need for surveillance.  Patient given informed consent, signed copy in the chart, time out was performed.  Placed in lithotomy position. Cervix viewed with speculum and colposcope after application of acetic acid.   Colposcopy adequate? Yes  acetowhite lesion(s) noted at surrounding the majority of the cervical os with most prominent lesions noted at 12,3 and 6 o'clock; biopsies obtained.  ECC specimen obtained. All specimens were labelled and sent to pathology.  Patient was given post procedure instructions.  Will follow up pathology and manage accordingly.  Routine preventative health maintenance measures emphasized.  Dr. Nehemiah Settle present for colposcopy.   Luvenia Redden, PA-C 12/23/2014 2:27 PM

## 2014-12-23 NOTE — Patient Instructions (Signed)
Colposcopy Care After Colposcopy is a procedure in which a special tool is used to magnify the surface of the cervix. A tissue sample (biopsy) may also be taken. This sample will be looked at for cervical cancer or other problems. After the test:  You may have some cramping.  Lie down for a few minutes if you feel lightheaded.   You may have some bleeding which should stop in a few days. HOME CARE  Do not have sex or use tampons for 2 to 3 days or as told.  Only take medicine as told by your doctor.  Continue to take your birth control pills as usual. Finding out the results of your test Ask when your test results will be ready. Make sure you get your test results. GET HELP RIGHT AWAY IF:  You are bleeding a lot or are passing blood clots.  You develop a fever of 102 F (38.9 C) or higher.  You have abnormal vaginal discharge.  You have cramps that do not go away with medicine.  You feel lightheaded, dizzy, or pass out (faint). MAKE SURE YOU:   Understand these instructions.  Will watch your condition.  Will get help right away if you are not doing well or get worse. Document Released: 10/13/2007 Document Revised: 07/19/2011 Document Reviewed: 11/23/2012 [facility] Patient Information 2015 [place], [place]. This information is not intended to replace advice given to you by your health care provider. Make sure you discuss any questions you have with your health care provider.

## 2014-12-24 ENCOUNTER — Telehealth: Payer: Self-pay | Admitting: Internal Medicine

## 2014-12-24 NOTE — Telephone Encounter (Signed)
Patient came into office, patient has the [facility] discount and would like referral to ortho to be placed like discussed. Please f/u

## 2014-12-30 ENCOUNTER — Telehealth: Payer: Self-pay | Admitting: General Practice

## 2014-12-30 DIAGNOSIS — B379 Candidiasis, unspecified: Secondary | ICD-10-CM

## 2014-12-30 MED ORDER — FLUCONAZOLE 150 MG PO TABS: 150.0000 mg | ORAL_TABLET | Freq: Once | ORAL | Status: DC

## 2014-12-30 NOTE — Telephone Encounter (Signed)
Patient called and left message stating she was here for a bx on 8/15. Patient states she thinks she has a yeast infection and a serious one. Patient states she also thinks she has a bladder infection. Called patient, stating I am returning your phone call. Patient states she is having a thick white d/c with itching and burning with urination. Told patient that we will send something in to her pharmacy for a yeast infection as that could cause irritation with urination as well. Told patient if the burning is still occuring but the yeast infection is gone to give [place] a call back. Patient verbalized understanding and asked about her colpo results. Told patient we should be contacting her in the next couple of days with results as Almyra Free has not had a chance to review those yet. Patient verbalized understanding and had no questions

## 2015-01-07 ENCOUNTER — Telehealth: Payer: Self-pay | Admitting: General Practice

## 2015-01-07 NOTE — Telephone Encounter (Addendum)
Called patient regarding colpo results and need for LEEP per Almyra Free. LEEP scheduled for 9/19. No answer- left message stating we are trying to reach you with results, please call us back at the clinics  8/30  1210  Pt returned call and left message on voice mail @ 1050 stating that she is calling for her results. I called her and discussed results of colpo which now require LEEP procedure.  Appt date and time given. Pt asked about the possibility of having a hysterectomy because "she doesn't get periods anymore anyway."  I stated that at this time, we do not have any reason to feel that a hysterectomy is warranted as the abnormal cells are of her cervix. I advised pt to discuss her concerns further with the doctor when she comes to clinic for LEEP procedure on 9/19 @ 1345. The LEEP procedure was explained briefly and pt confirmed that she will keep the appt as scheduled.  Diane Day RNC

## 2015-01-09 ENCOUNTER — Encounter: Payer: Self-pay | Admitting: Gastroenterology

## 2015-01-09 ENCOUNTER — Encounter: Payer: Self-pay | Admitting: Internal Medicine

## 2015-01-09 ENCOUNTER — Ambulatory Visit: Payer: Self-pay | Attending: Internal Medicine | Admitting: Internal Medicine

## 2015-01-09 VITALS — BP 118/82 | HR 58 | Temp 98.0°F | Resp 16 | Wt 213.0 lb

## 2015-01-09 DIAGNOSIS — R1032 Left lower quadrant pain: Secondary | ICD-10-CM | POA: Insufficient documentation

## 2015-01-09 DIAGNOSIS — F419 Anxiety disorder, unspecified: Secondary | ICD-10-CM | POA: Insufficient documentation

## 2015-01-09 DIAGNOSIS — Z79899 Other long term (current) drug therapy: Secondary | ICD-10-CM | POA: Insufficient documentation

## 2015-01-09 DIAGNOSIS — F17200 Nicotine dependence, unspecified, uncomplicated: Secondary | ICD-10-CM | POA: Insufficient documentation

## 2015-01-09 DIAGNOSIS — K219 Gastro-esophageal reflux disease without esophagitis: Secondary | ICD-10-CM | POA: Insufficient documentation

## 2015-01-09 DIAGNOSIS — F32A Depression, unspecified: Secondary | ICD-10-CM

## 2015-01-09 DIAGNOSIS — M419 Scoliosis, unspecified: Secondary | ICD-10-CM | POA: Insufficient documentation

## 2015-01-09 DIAGNOSIS — F329 Major depressive disorder, single episode, unspecified: Secondary | ICD-10-CM | POA: Insufficient documentation

## 2015-01-09 MED ORDER — SERTRALINE HCL 50 MG PO TABS: 75.0000 mg | ORAL_TABLET | Freq: Every day | ORAL | Status: DC

## 2015-01-09 NOTE — Patient Instructions (Addendum)
If you have not heard back from [facility] Orth[facility] by the end of next week call and make sure they were approved.  I increased Zoloft to 1.5 pills per day totaling 75 mg.

## 2015-01-09 NOTE — Progress Notes (Signed)
Patient ID: Maureen Underwood, female   DOB: Dec 24, 1975, 39 y.o.   MRN: [id number]  CC: depression  HPI: Maureen Underwood is a 39 y.o. female here today for a follow up visit.  Patient has past medical history of anxiety and depression.  Patient reports that now that zoloft is in her system she feels like she does not feel as down and depressed. She has gotten compliments about how her mood has changed from family and friends. She states that over the past 2-3 weeks she feels like she is beginning to feel like she is getting more agitated and short with people. She feels like her temper is coming back and she does not want to be bothered as much as before. Her only side effect of the medication is dry mouth. Patient states that she has now received the hospital discount and she is ready for referral to GI for LLQ pain and Ortho for her scoliosis.   Patient has No headache, No chest pain, No abdominal pain - No Nausea, No new weakness tingling or numbness, No Cough - SOB.  Allergies  Allergen Reactions  . Penicillins Hives   Past Medical History  Diagnosis Date  . Anxiety   . GERD (gastroesophageal reflux disease)    Current Outpatient Prescriptions on File Prior to Visit  Medication Sig Dispense Refill  . fluconazole (DIFLUCAN) 150 MG tablet Take 1 tablet (150 mg total) by mouth once. 1 tablet 0  . metroNIDAZOLE (FLAGYL) 500 MG tablet Take 1 tablet (500 mg total) by mouth 2 (two) times daily. 14 tablet 0  . omeprazole (PRILOSEC) 20 MG capsule Take 1 capsule (20 mg total) by mouth daily before breakfast. 30 capsule 3  . sertraline (ZOLOFT) 50 MG tablet Take 1 tablet (50 mg total) by mouth daily. 30 tablet 3  . sulfamethoxazole-trimethoprim (BACTRIM DS,SEPTRA DS) 800-160 MG per tablet Take 1 tablet by mouth 2 (two) times daily. (Patient not taking: Reported on 12/23/2014) 14 tablet 0  . traMADol (ULTRAM) 50 MG tablet Take 1 tablet (50 mg total) by mouth every 12 (twelve) hours as needed. 30 tablet 0    No current facility-administered medications on file prior to visit.   Family History  Problem Relation Age of Onset  . Diabetes Mother   . Heart disease Paternal Grandfather   . Hypertension Paternal Grandfather   . Cancer Paternal Grandfather    Social History   Social History  . Marital Status: Married    Spouse Name: N/A  . Number of Children: N/A  . Years of Education: N/A   Occupational History  . Not on file.   Social History Main Topics  . Smoking status: Current Every Day Smoker  . Smokeless tobacco: Never Used  . Alcohol Use: Not on file  . Drug Use: Not on file  . Sexual Activity: Yes   Other Topics Concern  . Not on file   Social History Narrative    Review of Systems: Other than what is stated in HPI, all other systems are negative.   Objective:   Filed Vitals:   01/09/15 1016  BP: 118/82  Pulse: 58  Temp: 98 F (36.7 C)  Resp: 16    Physical Exam  Cardiovascular: Normal rate, regular rhythm and normal heart sounds.   Pulmonary/Chest: Effort normal and breath sounds normal.  Skin: Skin is warm and dry.  Psychiatric: She has a normal mood and affect.    Lab Results  Component Value Date   WBC  9.0 12/04/2014   HGB 16.2* 12/04/2014   HCT 45.6 12/04/2014   MCV 92.9 12/04/2014   PLT 285 12/04/2014   Lab Results  Component Value Date   CREATININE 1.11* 12/04/2014   BUN 13 12/04/2014   NA 139 12/04/2014   K 4.5 12/04/2014   CL 104 12/04/2014   CO2 23 12/04/2014    No results found for: HGBA1C Lipid Panel     Component Value Date/Time   CHOL 210* 12/04/2014 1154   TRIG 148 12/04/2014 1154   HDL 29* 12/04/2014 1154   CHOLHDL 7.2* 12/04/2014 1154   VLDL 30 12/04/2014 1154   LDLCALC 151* 12/04/2014 1154       Assessment and plan:   Lashann was seen today for follow-up.  Diagnoses and all orders for this visit:  Depression/Anxiety  -     Increased to sertraline (ZOLOFT) 50 MG tablet; Take 1.5 tablets (75 mg total) by  mouth daily. Today's PHQ-9 score is 1 and GAD-7 is 7.  I have increased her zoloft to 75 mg daily and we will follow up again in 2 months to assess changes.   LLQ pain -     Ambulatory referral to Gastroenterology Stable, see previous notes  Scoliosis -     Ambulatory referral to Orthopedic Surgery Stable see previous notes    Return in about 2 months (around 03/11/2015) for depression.       Lance Bosch, [facility] [phone] 01/09/2015, 10:10 AM

## 2015-01-09 NOTE — Progress Notes (Signed)
Patient here for follow up on her depression and to discuss her lab work Patient feels her dose of zoloft needs to be increased Patient feels at times she gets" short" Patient also stated she is frustrated with trying to loose weight Patient has been exercising and has changed up her diet but is not loosing the weight

## 2015-01-17 ENCOUNTER — Telehealth: Payer: Self-pay

## 2015-01-17 DIAGNOSIS — M546 Pain in thoracic spine: Secondary | ICD-10-CM

## 2015-01-17 NOTE — Telephone Encounter (Signed)
Okay to refill with 30 tablets only no refills

## 2015-01-17 NOTE — Telephone Encounter (Signed)
Patient called requesting a refill on her tramadol Can we refill this

## 2015-01-20 MED ORDER — TRAMADOL HCL 50 MG PO TABS: 50.0000 mg | ORAL_TABLET | Freq: Two times a day (BID) | ORAL | Status: DC | PRN

## 2015-01-20 NOTE — Telephone Encounter (Signed)
Patient was called and advised of Rx at front desk. Pt. Stated she would pick up med tomorrow

## 2015-01-20 NOTE — Telephone Encounter (Signed)
Tramadol prescription printed and is at front desk

## 2015-01-21 ENCOUNTER — Telehealth: Payer: Self-pay

## 2015-01-21 NOTE — Telephone Encounter (Signed)
Returned patient phone call  Call was disconnected Tried to return call  Call went to voice mail Message left to return our call

## 2015-01-27 ENCOUNTER — Encounter: Payer: Self-pay | Admitting: Obstetrics & Gynecology

## 2015-02-05 ENCOUNTER — Ambulatory Visit: Payer: Self-pay | Admitting: Gastroenterology

## 2015-02-10 ENCOUNTER — Other Ambulatory Visit: Payer: Self-pay | Admitting: Internal Medicine

## 2015-02-11 ENCOUNTER — Telehealth: Payer: Self-pay

## 2015-02-11 DIAGNOSIS — K219 Gastro-esophageal reflux disease without esophagitis: Secondary | ICD-10-CM

## 2015-02-11 MED ORDER — OMEPRAZOLE 20 MG PO CPDR: 20.0000 mg | DELAYED_RELEASE_CAPSULE | Freq: Every day | ORAL | Status: DC

## 2015-02-11 NOTE — Telephone Encounter (Signed)
Patient called requesting a refill on her prilosec [facility]

## 2015-02-19 ENCOUNTER — Encounter: Payer: Self-pay | Admitting: Obstetrics & Gynecology

## 2015-02-19 ENCOUNTER — Other Ambulatory Visit (HOSPITAL_COMMUNITY)
Admission: RE | Admit: 2015-02-19 | Discharge: 2015-02-19 | Disposition: A | Discharge status: Home / Self Care | Payer: Self-pay | Source: Ambulatory Visit | Attending: Obstetrics & Gynecology | Admitting: Obstetrics & Gynecology

## 2015-02-19 ENCOUNTER — Ambulatory Visit (INDEPENDENT_AMBULATORY_CARE_PROVIDER_SITE_OTHER): Payer: Self-pay | Admitting: Obstetrics & Gynecology

## 2015-02-19 VITALS — BP 114/65 | HR 73 | Temp 98.5°F | Ht 66.0 in | Wt 218.0 lb

## 2015-02-19 DIAGNOSIS — R87613 High grade squamous intraepithelial lesion on cytologic smear of cervix (HGSIL): Secondary | ICD-10-CM

## 2015-02-19 DIAGNOSIS — Z01812 Encounter for preprocedural laboratory examination: Secondary | ICD-10-CM | POA: Insufficient documentation

## 2015-02-19 DIAGNOSIS — Z3202 Encounter for pregnancy test, result negative: Secondary | ICD-10-CM

## 2015-02-19 DIAGNOSIS — D069 Carcinoma in situ of cervix, unspecified: Secondary | ICD-10-CM | POA: Insufficient documentation

## 2015-02-19 LAB — POCT PREGNANCY, URINE: Preg Test, Ur: NEGATIVE

## 2015-02-19 NOTE — Patient Instructions (Signed)
Loop Electrosurgical Excision Procedure, Care After Refer to this sheet in the next few weeks. These instructions provide you with information on caring for yourself after your procedure. Your caregiver may also give you more specific instructions. Your treatment has been planned according to current medical practices, but problems sometimes occur. Call your caregiver if you have any problems or questions after your procedure. HOME CARE INSTRUCTIONS   Do not use tampons, douche, or have sexual intercourse for 2 weeks or as directed by your caregiver.  Begin normal activities if you have no or minimal cramping or bleeding, unless directed otherwise by your caregiver.  Take your temperature if you feel sick. Write down your temperature on paper, and tell your caregiver if you have a fever.  Take all medicines as directed by your caregiver.  Keep all your follow-up appointments and Pap tests as directed by your caregiver. SEEK IMMEDIATE MEDICAL CARE IF:   You have bleeding that is heavier or longer than a normal menstrual cycle.  You have bleeding that is bright red.  You have blood clots.  You have a fever.  You have increasing cramps or pain not relieved by medicine.  You develop abdominal pain that does not seem to be related to the same area of earlier cramping and pain.  You are lightheaded, unusually weak, or faint.  You develop painful or bloody urination.  You develop a bad smelling vaginal discharge. MAKE SURE YOU:  Understand these instructions.  Will watch your condition.  Will get help right away if you are not doing well or get worse.   This information is not intended to replace advice given to you by your health care provider. Make sure you discuss any questions you have with your health care provider.   Document Released: 01/07/2011 Document Revised: 05/17/2014 Document Reviewed: 01/07/2011 Elsevier Interactive Patient Education 2016 [place]  Electrosurgical Excision Procedure Loop electrosurgical excision procedure (LEEP) is the removal of a portion of the lower part of the uterus (cervix). The procedure is done when there are significantly abnormal cervical cell changes. Abnormal cell changes of the cervix can lead to cancer if left in place and untreated.  The LEEP procedure itself typically only takes a few minutes. Often, it may be done in your caregiver's office. The procedure is considered safe for those who wish to get pregnant or are trying to get pregnant. Only under rare circumstances should this procedure be done if you are pregnant. LET YOUR CAREGIVER KNOW ABOUT:  Whether you are pregnant or late for your last menstrual period.  Allergies to foods or medicines.  All the medicines you are taking includingherbs, eyedrops, and over-the-counter medicines, and creams.  Use of steroids (by mouth or creams).  Previous problems with anesthetics or numbing medicine.  Previous gynecological surgery.  History of blood clots or bleeding problems.  Any recent or current vaginal infections (herpes, sexually transmitted infections).  Other health problems. RISKS AND COMPLICATIONS  Bleeding.  Infection.  Injury to the vagina, bladder, or rectum.  Very rare obstruction of the cervical opening that causes problems during menstruation (cervical stenosis). BEFORE THE PROCEDURE  Do not take aspirin or blood thinners (anticoagulants) for 1 week before the procedure, or as told by your caregiver.  Eat a light meal before the procedure.  Ask your caregiver about changing or stopping your regular medicines.  You may be given a pain reliever 1 or 2 hours before the procedure. PROCEDURE   A tool (speculum) is placed  in the vagina. This allows your caregiver to see the cervix.  An iodine stain is applied to the cervix to find the area of abnormal cells to be removed.  Medicine is injected to numb the cervix (local  anesthetic).   Electricity is passed through a thin wire loop which is then used to remove (cauterize) a small segment of the affected cervix.  Light electrocautery is used to seal any small blood vessels and prevent bleeding.  A paste may be applied to the cauterized area of the cervix to help prevent bleeding.  The tissue sample is sent to the lab. It is examined under the microscope. AFTER THE PROCEDURE  Have someone drive you home.  You may have slight to moderate cramping.  You may notice a black vaginal discharge from the paste used on the cervix to prevent bleeding. This is normal.  Watch for excessive bleeding. This requires immediate medical care.  Ask when your test results will be ready. Make sure you get your test results.   This information is not intended to replace advice given to you by your health care provider. Make sure you discuss any questions you have with your health care provider.   Document Released: 07/17/2002 Document Revised: 07/19/2011 Document Reviewed: 10/06/2010 Elsevier Interactive Patient Education [employer].

## 2015-02-19 NOTE — Progress Notes (Signed)
39 y.o. 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   Patient identified, informed consent obtained, signed copy in chart, time out performed.  Pap smear and colposcopy reviewed.   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  Cervix visualized. Lugol's solution applied Paracervical block placed.  Medium fisher loop used to remove cone of cervix using blend of cut and cautery on LEEP machine.  Edges/Base cauterized with Ball.  Monsel's solution used for hemostasis.  Patient tolerated procedure well.  Patient given post procedure instructions.  Follow up in 12 months for repeat pap or as needed.  Woodroe Mode, MD 02/19/2015

## 2015-03-04 ENCOUNTER — Telehealth: Payer: Self-pay | Admitting: General Practice

## 2015-03-04 NOTE — Telephone Encounter (Signed)
Per Dr Roselie Awkward, patient needs pap with cotesting in one year. Called patient, no answer- left message stating we are trying to reach you with non urgent results, please call us back at the clinics.

## 2015-03-05 NOTE — Telephone Encounter (Signed)
Received message left on nurse line on 03/05/15 at 1214.  Patient states she is returning our call for her leep results.  Requests a return call to [phone] or [phone].

## 2015-03-05 NOTE — Telephone Encounter (Signed)
Called pt and informed her of LEEP results as well as need for Pap w/Co-testing in 1 year. Pt's questions were answered to her satisfaction. She voiced understanding of all information and instructions given.

## 2015-03-13 ENCOUNTER — Other Ambulatory Visit (HOSPITAL_COMMUNITY): Payer: Self-pay | Admitting: Surgery

## 2015-03-14 ENCOUNTER — Other Ambulatory Visit (HOSPITAL_COMMUNITY): Payer: Self-pay | Admitting: Surgery

## 2015-03-14 DIAGNOSIS — M545 Low back pain: Secondary | ICD-10-CM

## 2015-03-19 ENCOUNTER — Encounter: Payer: Self-pay | Admitting: Internal Medicine

## 2015-03-19 ENCOUNTER — Ambulatory Visit: Payer: Self-pay | Attending: Internal Medicine | Admitting: Internal Medicine

## 2015-03-19 VITALS — BP 123/85 | HR 67 | Temp 98.0°F | Resp 16 | Ht 66.0 in | Wt 219.6 lb

## 2015-03-19 DIAGNOSIS — W57XXXA Bitten or stung by nonvenomous insect and other nonvenomous arthropods, initial encounter: Secondary | ICD-10-CM | POA: Insufficient documentation

## 2015-03-19 DIAGNOSIS — F32A Depression, unspecified: Secondary | ICD-10-CM

## 2015-03-19 DIAGNOSIS — E669 Obesity, unspecified: Secondary | ICD-10-CM | POA: Insufficient documentation

## 2015-03-19 DIAGNOSIS — K219 Gastro-esophageal reflux disease without esophagitis: Secondary | ICD-10-CM | POA: Insufficient documentation

## 2015-03-19 DIAGNOSIS — Z0000 Encounter for general adult medical examination without abnormal findings: Secondary | ICD-10-CM | POA: Insufficient documentation

## 2015-03-19 DIAGNOSIS — F329 Major depressive disorder, single episode, unspecified: Secondary | ICD-10-CM | POA: Insufficient documentation

## 2015-03-19 DIAGNOSIS — T148 Other injury of unspecified body region: Secondary | ICD-10-CM | POA: Insufficient documentation

## 2015-03-19 MED ORDER — OMEPRAZOLE 20 MG PO CPDR: 20.0000 mg | DELAYED_RELEASE_CAPSULE | Freq: Every day | ORAL | Status: DC

## 2015-03-19 MED ORDER — SERTRALINE HCL 50 MG PO TABS
75.0000 mg | ORAL_TABLET | Freq: Every day | ORAL | Status: DC
Start: 2015-03-19 — End: 2015-07-03

## 2015-03-19 MED ORDER — TRIAMCINOLONE ACETONIDE 0.025 % EX OINT: 1.0000 "application " | TOPICAL_OINTMENT | Freq: Two times a day (BID) | CUTANEOUS | Status: DC

## 2015-03-19 NOTE — Progress Notes (Signed)
Patient ID: Maureen Underwood, female   DOB: 1975-09-04, 39 y.o.   MRN: [id number]  CC: depression and weight loss  HPI: Maureen Underwood is a 39 y.o. female here today for a follow up visit.  Patient has past medical history of anxiety and GERD. Patient states that she has been eating a low calorie diet but has not been counting her daily calories. She is exercising three times per week. She states that despite her lifestyle changes she is having a hard time losing the weight. She has been reading about different weight loss options and would like to try Phentermine to see if it would help her.  She reports that since her last increase in Zoloft she has noticed a significant difference in her mood. She would like refills of medications today. Patient is concerned about bug bites that she has been experiencing for the past week. She states that she does have 2 dogs at home but they have been checked for fleas. No one else in the home has bug bites or rash. Patient also reports that she checked her bed for bugs and has found nothing. Rash is present on BUE and BLE. None on chest or back.   Patient has No headache, No chest pain, No abdominal pain - No Nausea, No new weakness tingling or numbness, No Cough - SOB.  Allergies  Allergen Reactions  . Penicillins Hives   Past Medical History  Diagnosis Date  . Anxiety   . GERD (gastroesophageal reflux disease)    Current Outpatient Prescriptions on File Prior to Visit  Medication Sig Dispense Refill  . omeprazole (PRILOSEC) 20 MG capsule Take 1 capsule (20 mg total) by mouth daily before breakfast. 30 capsule 3  . sertraline (ZOLOFT) 50 MG tablet Take 1.5 tablets (75 mg total) by mouth daily. 45 tablet 3  . fluconazole (DIFLUCAN) 150 MG tablet Take 1 tablet (150 mg total) by mouth once. (Patient not taking: Reported on 01/09/2015) 1 tablet 0  . metroNIDAZOLE (FLAGYL) 500 MG tablet Take 1 tablet (500 mg total) by mouth 2 (two) times daily. (Patient not  taking: Reported on 01/09/2015) 14 tablet 0  . sulfamethoxazole-trimethoprim (BACTRIM DS,SEPTRA DS) 800-160 MG per tablet Take 1 tablet by mouth 2 (two) times daily. (Patient not taking: Reported on 12/23/2014) 14 tablet 0  . traMADol (ULTRAM) 50 MG tablet Take 1 tablet (50 mg total) by mouth every 12 (twelve) hours as needed. 30 tablet 0   No current facility-administered medications on file prior to visit.   Family History  Problem Relation Age of Onset  . Diabetes Mother   . Heart disease Paternal Grandfather   . Hypertension Paternal Grandfather   . Cancer Paternal Grandfather    Social History   Social History  . Marital Status: Married    Spouse Name: N/A  . Number of Children: N/A  . Years of Education: N/A   Occupational History  . Not on file.   Social History Main Topics  . Smoking status: Current Every Day Smoker  . Smokeless tobacco: Never Used  . Alcohol Use: Not on file  . Drug Use: Not on file  . Sexual Activity: Yes   Other Topics Concern  . Not on file   Social History Narrative    Review of Systems: Other than what is stated in HPI, all other systems are negative.   Objective:   Filed Vitals:   03/19/15 1652  BP: 123/85  Pulse: 67  Temp: 98 F (36.7  C)  Resp: 16    Physical Exam  Constitutional: She is oriented to person, place, and time.  obese  Neck: No thyromegaly present.  Cardiovascular: Normal rate, regular rhythm and normal heart sounds.   Pulmonary/Chest: Effort normal and breath sounds normal.  Abdominal: Soft. Bowel sounds are normal. There is no tenderness.  Abdominal circumference of 45.5 cm  Neurological: She is alert and oriented to person, place, and time.  Skin: Rash (BUE/BLE) noted. There is erythema.  Psychiatric: She has a normal mood and affect. Her behavior is normal.     Lab Results  Component Value Date   WBC 9.0 12/04/2014   HGB 16.2* 12/04/2014   HCT 45.6 12/04/2014   MCV 92.9 12/04/2014   PLT 285 12/04/2014    Lab Results  Component Value Date   CREATININE 1.11* 12/04/2014   BUN 13 12/04/2014   NA 139 12/04/2014   K 4.5 12/04/2014   CL 104 12/04/2014   CO2 23 12/04/2014    No results found for: HGBA1C Lipid Panel     Component Value Date/Time   CHOL 210* 12/04/2014 1154   TRIG 148 12/04/2014 1154   HDL 29* 12/04/2014 1154   CHOLHDL 7.2* 12/04/2014 1154   VLDL 30 12/04/2014 1154   LDLCALC 151* 12/04/2014 1154       Assessment and plan:   Maureen Underwood was seen today for follow-up.  Diagnoses and all orders for this visit:  Obesity -     Amb Referral to Nutrition and Diabetic E -     COMPLETE METABOLIC PANEL WITH GFR I will refer patient to nutrition. If her kidney function has returned to normal I will be willing to prescribe Phentermine to patient. I have stressed the importance of staying hydrated with medication and taking diet and exercise serious during this time. I have explained that medication is meant to suppress appetite but she still has to work to get weight off. If patient has not lost 5% of her body weight in 3 months then she will no longer get prescriptions.   Depression -     sertraline (ZOLOFT) 50 MG tablet; Take 1.5 tablets (75 mg total) by mouth daily. Stable, will continue medications  Gastroesophageal reflux disease without esophagitis -     omeprazole (PRILOSEC) 20 MG capsule; Take 1 capsule (20 mg total) by mouth daily before breakfast. Stable, refilled   Bug bites -     Begin triamcinolone (KENALOG) 0.025 % ointment; Apply 1 application topically 2 (two) times daily.  Health care maintenance -     Flu Vaccine QUAD 36+ mos PF IM (Fluarix & Fluzone Quad PF)  Total time spent with patient was 25 minutes. > 50% spent counseling and coordination care with patient.   Return in about 6 months (around 09/16/2015).       Lance Bosch, [facility] [phone] 03/19/2015, 5:11 PM

## 2015-03-19 NOTE — Progress Notes (Signed)
Patient here for follow up on her depression Patient is asking if you would prescribe phentermine to help her loose weight Patient's abd circumference is 45.5

## 2015-03-20 LAB — COMPLETE METABOLIC PANEL WITH GFR
ALT: 21 U/L (ref 6–29)
AST: 17 U/L (ref 10–30)
Albumin: 3.7 g/dL (ref 3.6–5.1)
Alkaline Phosphatase: 62 U/L (ref 33–115)
BUN: 10 mg/dL (ref 7–25)
CHLORIDE: 108 mmol/L (ref 98–110)
CO2: 26 mmol/L (ref 20–31)
Calcium: 8.9 mg/dL (ref 8.6–10.2)
Creat: 0.91 mg/dL (ref 0.50–1.10)
GFR, Est African American: >89 mL/min (ref >=60)
GFR, Est Non African American: 81 mL/min (ref >=60)
GLUCOSE: 86 mg/dL (ref 65–99)
POTASSIUM: 4.4 mmol/L (ref 3.5–5.3)
SODIUM: 139 mmol/L (ref 135–146)
Total Bilirubin: 0.5 mg/dL (ref 0.2–1.2)
Total Protein: 5.7 g/dL — ABNORMAL LOW (ref 6.1–8.1)

## 2015-03-24 ENCOUNTER — Telehealth: Payer: Self-pay | Admitting: Internal Medicine

## 2015-03-24 NOTE — Telephone Encounter (Signed)
Patient called and requested her blood work results, please f/u

## 2015-03-25 ENCOUNTER — Other Ambulatory Visit: Payer: Self-pay | Admitting: Internal Medicine

## 2015-03-25 ENCOUNTER — Telehealth: Payer: Self-pay

## 2015-03-25 DIAGNOSIS — E669 Obesity, unspecified: Secondary | ICD-10-CM

## 2015-03-25 MED ORDER — PHENTERMINE HCL 37.5 MG PO TABS: 37.5000 mg | ORAL_TABLET | Freq: Every day | ORAL | Status: DC

## 2015-03-25 NOTE — Telephone Encounter (Signed)
Patient called requesting her lab results Please review and let me know if you are going to prescribe phentermine for her thanks

## 2015-03-25 NOTE — Telephone Encounter (Signed)
Spoke with patient this am to let her know that her provider Has written her RX for phentermine Patient is aware she needs to stop in for urine pregnancy test before we can  Give the RX to her

## 2015-03-27 ENCOUNTER — Other Ambulatory Visit (INDEPENDENT_AMBULATORY_CARE_PROVIDER_SITE_OTHER): Payer: Self-pay

## 2015-03-27 ENCOUNTER — Ambulatory Visit (HOSPITAL_COMMUNITY)
Admission: RE | Admit: 2015-03-27 | Discharge: 2015-03-27 | Disposition: A | Discharge status: Home / Self Care | Payer: Self-pay | Source: Ambulatory Visit | Attending: Surgery | Admitting: Surgery

## 2015-03-27 ENCOUNTER — Encounter: Payer: Self-pay | Admitting: Gastroenterology

## 2015-03-27 ENCOUNTER — Ambulatory Visit (INDEPENDENT_AMBULATORY_CARE_PROVIDER_SITE_OTHER): Payer: Self-pay | Admitting: Gastroenterology

## 2015-03-27 ENCOUNTER — Ambulatory Visit: Payer: Self-pay | Attending: Internal Medicine

## 2015-03-27 VITALS — BP 126/84 | HR 78 | Temp 98.0°F | Resp 16

## 2015-03-27 VITALS — BP 122/72 | HR 76 | Ht 66.0 in | Wt 217.6 lb

## 2015-03-27 DIAGNOSIS — R109 Unspecified abdominal pain: Secondary | ICD-10-CM

## 2015-03-27 DIAGNOSIS — M545 Low back pain: Secondary | ICD-10-CM | POA: Insufficient documentation

## 2015-03-27 DIAGNOSIS — M419 Scoliosis, unspecified: Secondary | ICD-10-CM | POA: Insufficient documentation

## 2015-03-27 DIAGNOSIS — Z79899 Other long term (current) drug therapy: Secondary | ICD-10-CM

## 2015-03-27 DIAGNOSIS — R197 Diarrhea, unspecified: Secondary | ICD-10-CM

## 2015-03-27 DIAGNOSIS — K580 Irritable bowel syndrome with diarrhea: Secondary | ICD-10-CM | POA: Insufficient documentation

## 2015-03-27 DIAGNOSIS — K219 Gastro-esophageal reflux disease without esophagitis: Secondary | ICD-10-CM

## 2015-03-27 DIAGNOSIS — M5136 Other intervertebral disc degeneration, lumbar region: Secondary | ICD-10-CM | POA: Insufficient documentation

## 2015-03-27 LAB — POCT URINE PREGNANCY: Preg Test, Ur: NEGATIVE

## 2015-03-27 LAB — IGA: IgA: 185 mg/dL (ref 68–378)

## 2015-03-27 MED ORDER — OMEPRAZOLE 20 MG PO CPDR: 20.0000 mg | DELAYED_RELEASE_CAPSULE | Freq: Two times a day (BID) | ORAL | Status: DC

## 2015-03-27 MED ORDER — DICYCLOMINE HCL 10 MG PO CAPS: 10.0000 mg | ORAL_CAPSULE | Freq: Three times a day (TID) | ORAL | Status: DC | PRN

## 2015-03-27 MED ORDER — NA SULFATE-K SULFATE-MG SULF 17.5-3.13-1.6 GM/177ML PO SOLN: ORAL | Status: DC

## 2015-03-27 NOTE — Progress Notes (Unsigned)
Patient here to pick up prescription for phentermine Per office protocol- patient must do urine pregnancy test before we  Can give her the prescription

## 2015-03-27 NOTE — Progress Notes (Signed)
HPI :  39 y/o female seen in consultation for bowel habit changes and abdominal pains from Chari Manning NP   Patient reports she has had symptoms for the past year, but worsening over the past 7 months. She thought she initially had a lactose deficiency problem and cut back on dairy foodd which did not make a difference at all. She reports she eats and can have a sharp pain in her LLQ with significant urgency to have a bowel movement, with diarrhea. She thinks within 5 minutes of eating she has the urge to have a bowel movement, it is hard to get to a bathroom in time. She has roughly on average 8-9 BMs per day. No blood in the stools. She has had some change in stool color but no blood appreciated. She reports mostly loose stools, rarely constipated stools. She reports the pain is relived with the BM. She reports she is gaining weight, no weight loss. She is going to start phentermine soon for weight loss. She has rare nocturnal symptoms.  She reports she has also had some postprandial epigastric pain associated with some vomiting. She thinks prilosec has significantly helped with this symptom. She reports this has been ongoing for roughly 1-2 years or so, perhaps longer. She reports this occurs daily if she does not take anything. She thinks after roughly 20-30 minutes of eating she will feel the discomfort, but rarely it can occur while she eats. She vomits which will relieve the symptom and then she will feel better. The pain is mostly in the epigastric, pain does not radiate. The pain will take several hours to resolve if she does not vomit. Prilosec has helped significantly with reducing the frequency but it still occurs to some extent. She takes 20mg  once daily of Prilosec. She has some heartburn at baseline, she does not think she has much breakthrough on this regimen. No dysphagia. She reports she takes tylenol PRN For pain, as well as tramadol for back pain. She denies other NSAID use. She has  been on zoloft for the past 4 months, symptoms have preceeded its use.   No FH of IBD, celiac, or CRC. Grandfather had pancreatic cancer.   She reports in 2010 she had severe vomiting, associated with hematemesis. She reports she was evaluated at that time and told she had "ulcers" but did not have an EGD.    Past Medical History  Diagnosis Date  . Anxiety   . GERD (gastroesophageal reflux disease)   . Diarrhea   . Scoliosis     with associated back pain     Past Surgical History  Procedure Laterality Date  . Dilation and curettage of uterus      2005  . Poly rectal cyst    . Cervical biopsy  w/ loop electrode excision     Family History  Problem Relation Age of Onset  . Diabetes Mother   . Heart disease Paternal Grandfather   . Hypertension Paternal Grandfather   . Cancer Paternal Grandfather    Social History  Substance Use Topics  . Smoking status: Current Every Day Smoker  . Smokeless tobacco: Never Used  . Alcohol Use: No   Current Outpatient Prescriptions  Medication Sig Dispense Refill  . omeprazole (PRILOSEC) 20 MG capsule Take 1 capsule (20 mg total) by mouth 2 (two) times daily before a meal. 60 capsule 6  . phentermine (ADIPEX-P) 37.5 MG tablet Take 1 tablet (37.5 mg total) by mouth daily before breakfast. 30  tablet 2  . sertraline (ZOLOFT) 50 MG tablet Take 1.5 tablets (75 mg total) by mouth daily. 45 tablet 5  . traMADol (ULTRAM) 50 MG tablet Take by mouth.    . triamcinolone (KENALOG) 0.025 % ointment Apply 1 application topically 2 (two) times daily. 30 g 0  . dicyclomine (BENTYL) 10 MG capsule Take 1 capsule (10 mg total) by mouth every 8 (eight) hours as needed for spasms. 60 capsule 1  . Na Sulfate-K Sulfate-Mg Sulf SOLN Take as directed per Colonoscopy. 354 mL 0   No current facility-administered medications for this visit.   Allergies  Allergen Reactions  . Penicillins Hives     Review of Systems: All systems reviewed and negative except  where noted in HPI.     Lab Results  Component Value Date   WBC 9.0 12/04/2014   HGB 16.2* 12/04/2014   HCT 45.6 12/04/2014   MCV 92.9 12/04/2014   PLT 285 12/04/2014   Lab Results  Component Value Date   ALT 21 03/19/2015   AST 17 03/19/2015   ALKPHOS 62 03/19/2015   BILITOT 0.5 03/19/2015    Lab Results  Component Value Date   CREATININE 0.91 03/19/2015   BUN 10 03/19/2015   NA 139 03/19/2015   K 4.4 03/19/2015   CL 108 03/19/2015   CO2 26 03/19/2015    Physical Exam: BP 122/72 mmHg  Pulse 76  Ht 5\' 6"  (1.676 m)  Wt 217 lb 9.6 oz (98.703 kg)  BMI 35.14 kg/m2  SpO2 97% Constitutional: Pleasant,well-developed, female in no acute distress. HEENT: Normocephalic and atraumatic. Conjunctivae are normal. No scleral icterus. Neck supple.  Cardiovascular: Normal rate, regular rhythm.  Pulmonary/chest: Effort normal and breath sounds normal. No wheezing, rales or rhonchi. Abdominal: Soft, nondistended, protuberant, nontender. Bowel sounds active throughout. There are no masses palpable. No hepatomegaly. Extremities: no edema Lymphadenopathy: No cervical adenopathy noted. Neurological: Alert and oriented to person place and time. Skin: Skin is warm and dry. No rashes noted. Psychiatric: Normal mood and affect. Behavior is normal.   ASSESSMENT AND PLAN: 39 y/o female with a history of anxiety with previously normal bowel habits, seen for one year worth of diarrhea with significant urgency, as well as ongoing postprandial epigastric pain with vomiting. Upper tract symptoms improved with low dose Prilosec but persist to some extent.   Given her persistent symptoms, I offered her an EGD and colonoscopy to evaluate these complaints, rule out PUD, H pylori, celiac, IBD, microscopic colitis, etc. In the interim, will send celiac serologies and I recommend she increase her Prilosec to BID to see if this helps. She could otherwise have IBS however given the frequency and  significant urge she endorses with rare nocturnal symptoms, recommend endoscopic evaluation. Zoloft can also cause diarrhea, but her symptoms appear to predate its use. I counseled her on a low FODMOP diet to see if this provides any benefit, and gave her Bentyl to use PRN as well for cramps. If EGD is negative, could consider RUQ [place] to evaluate for gallstones, although her history is not classic for biliary colic.   The indications, risks, and benefits of EGD and colonoscopy were explained to the patient in detail. Risks include but are not limited to bleeding, perforation, adverse reaction to medications, and cardiopulmonary compromise. Sequelae include but are not limited to the possibility of surgery, hositalization, and mortality. The patient verbalized understanding and wished to proceed. All questions answered, referred to scheduler and bowel prep ordered. Further recommendations pending  results of the exam.   Dickinson Cellar, MD [facility] Pager [phone]  CC: Chari Manning NP

## 2015-03-27 NOTE — Patient Instructions (Addendum)
You have been scheduled for an endoscopy and colonoscopy. Please follow the written instructions given to you at your visit today. Please pick up your prep supplies at the pharmacy within the next 1-3 days. If you use inhalers (even only as needed), please bring them with you on the day of your procedure. Your physician has requested that you go to www.startemmi.com and enter the access code given to you at your visit today. This web site gives a general overview about your procedure. However, you should still follow specific instructions given to you by our office regarding your preparation for the procedure.   We have sent medications to your pharmacy for you to pick up at your convenience:  Your physician has requested that you go to the basement for lab work before leaving today.  

## 2015-03-28 ENCOUNTER — Ambulatory Visit: Payer: Self-pay | Admitting: Skilled Nursing Facility1

## 2015-03-28 LAB — TISSUE TRANSGLUTAMINASE, IGA: TISSUE TRANSGLUTAMINASE AB, IGA: 1 U/mL (ref <4)

## 2015-03-31 ENCOUNTER — Other Ambulatory Visit: Payer: Self-pay | Admitting: Internal Medicine

## 2015-04-25 ENCOUNTER — Other Ambulatory Visit: Payer: Self-pay | Admitting: Internal Medicine

## 2015-04-28 ENCOUNTER — Telehealth: Payer: Self-pay

## 2015-04-28 NOTE — Telephone Encounter (Signed)
Returned patient phone call Patient is requesting an RX for Bactrim for an abscess she has been treated for  In the past in her vaginal area Patient states she now has one on the opposite side of her vaginal and also one on her elbow Did you want to treat her or bring her in to be evaluated

## 2015-04-28 NOTE — Telephone Encounter (Signed)
Returned patient phone call Patient not available Message left on voice mail to return our call 

## 2015-04-29 ENCOUNTER — Other Ambulatory Visit: Payer: Self-pay | Admitting: Internal Medicine

## 2015-04-29 ENCOUNTER — Telehealth: Payer: Self-pay

## 2015-04-29 DIAGNOSIS — L0291 Cutaneous abscess, unspecified: Secondary | ICD-10-CM

## 2015-04-29 MED ORDER — SULFAMETHOXAZOLE-TRIMETHOPRIM 800-160 MG PO TABS: 1.0000 | ORAL_TABLET | Freq: Two times a day (BID) | ORAL | Status: DC

## 2015-04-29 NOTE — Telephone Encounter (Signed)
Returned patient phone call and she is aware her RX for bactrim was Sent to [facility]

## 2015-04-29 NOTE — Telephone Encounter (Signed)
Prescription has been sent.

## 2015-05-02 ENCOUNTER — Ambulatory Visit: Payer: Self-pay | Attending: Internal Medicine

## 2015-05-21 MED FILL — SUPREP BOWEL PREP KIT: 17.5-3.13-1 | 1 days supply | Qty: 354 | Fill #0

## 2015-05-26 ENCOUNTER — Encounter: Payer: Self-pay | Admitting: Gastroenterology

## 2015-05-26 ENCOUNTER — Ambulatory Visit (AMBULATORY_SURGERY_CENTER): Payer: Self-pay | Admitting: Gastroenterology

## 2015-05-26 VITALS — BP 125/73 | HR 59 | Temp 96.9°F | Resp 24 | Ht 66.0 in | Wt 217.0 lb

## 2015-05-26 DIAGNOSIS — K317 Polyp of stomach and duodenum: Secondary | ICD-10-CM

## 2015-05-26 DIAGNOSIS — K219 Gastro-esophageal reflux disease without esophagitis: Secondary | ICD-10-CM

## 2015-05-26 DIAGNOSIS — D123 Benign neoplasm of transverse colon: Secondary | ICD-10-CM

## 2015-05-26 DIAGNOSIS — D122 Benign neoplasm of ascending colon: Secondary | ICD-10-CM

## 2015-05-26 DIAGNOSIS — D125 Benign neoplasm of sigmoid colon: Secondary | ICD-10-CM

## 2015-05-26 DIAGNOSIS — R197 Diarrhea, unspecified: Secondary | ICD-10-CM

## 2015-05-26 DIAGNOSIS — R109 Unspecified abdominal pain: Secondary | ICD-10-CM

## 2015-05-26 MED ORDER — SODIUM CHLORIDE 0.9 % IV SOLN: 500.0000 mL | INTRAVENOUS | Status: DC

## 2015-05-26 NOTE — Progress Notes (Signed)
Report to PACU, RN, vss, BBS= Clear.  

## 2015-05-26 NOTE — Progress Notes (Signed)
[facility] asked if they would still do procedure although the patient did not stop her pheneramine. He said that the procedure would still be done per Jayme Cloud, RN.

## 2015-05-26 NOTE — Op Note (Signed)
[place]  [address] [place], [id number]   ENDOSCOPY PROCEDURE REPORT  PATIENT: Maureen, Underwood  MR#: [id number] BIRTHDATE: 04-13-1976 , 39  yrs. old GENDER: female ENDOSCOPIST: Yetta Flock, MD REFERRED BY: PROCEDURE DATE:  05/26/2015 PROCEDURE:  EGD w/ biopsy ASA CLASS:     Class II INDICATIONS:  upper abdominal pain and chronic diarrhea. MEDICATIONS: Propofol 250 mg IV TOPICAL ANESTHETIC:  DESCRIPTION OF PROCEDURE: After the risks benefits and alternatives of the procedure were thoroughly explained, informed consent was obtained.  The LB [id number] endoscope was introduced through the mouth and advanced to the second portion of the duodenum , Without limitations.  The instrument was slowly withdrawn as the mucosa was fully examined.   FINDINGS: The esophagus was normal in appearance.  DH, GEJ, and SCJ located 39cm from the incisors.  The stomach was remarkable for a 5-13mm benign appearing sessile polyp in the antrum, superior to the pylorus.  Multiple biopsies obtained and it was thought to have been mostly removed with cold forceps.  The remainder of the examined stomach appeared normal, biopsies taken to rule out H pylori.  The dudoenal bulb and 2nd portion of the examined duodenum were normal.  Biopsies taken to rule out celiac.  Retroflexed views revealed no abnormalities.     The scope was then withdrawn from the patient and the procedure completed.  COMPLICATIONS: There were no immediate complications.  ENDOSCOPIC IMPRESSION: Normal esophagus Benign appearing gastric polyp, biopsied Normal remainder of examined stomach, biopsies taken to rule out H pylori Normal duodenum - biopsies taken to rule out celiac   RECOMMENDATIONS: Await pathology results Resume diet Resume medications    eSigned:  Yetta Flock, MD 05/26/2015 3:32 PM    CC: the patient  PATIENT NAME:  Maureen, Underwood MR#: [id number]

## 2015-05-26 NOTE — Patient Instructions (Signed)
Discharge instructions given. Handouts on polyps,diverticulosis and hemorrhoids. Biopsies taken. No aspirin or NSAIDS for 2 weeks. Resume previous medications. YOU HAD AN ENDOSCOPIC PROCEDURE TODAY AT [facility]:   Refer to the procedure report that was given to you for any specific questions about what was found during the examination.  If the procedure report does not answer your questions, please call your gastroenterologist to clarify.  If you requested that your care partner not be given the details of your procedure findings, then the procedure report has been included in a sealed envelope for you to review at your convenience later.  YOU SHOULD EXPECT: Some feelings of bloating in the abdomen. Passage of more gas than usual.  Walking can help get rid of the air that was put into your GI tract during the procedure and reduce the bloating. If you had a lower endoscopy (such as a colonoscopy or flexible sigmoidoscopy) you may notice spotting of blood in your stool or on the toilet paper. If you underwent a bowel prep for your procedure, you may not have a normal bowel movement for a few days.  Please Note:  You might notice some irritation and congestion in your nose or some drainage.  This is from the oxygen used during your procedure.  There is no need for concern and it should clear up in a day or so.  SYMPTOMS TO REPORT IMMEDIATELY:   Following lower endoscopy (colonoscopy or flexible sigmoidoscopy):  Excessive amounts of blood in the stool  Significant tenderness or worsening of abdominal pains  Swelling of the abdomen that is new, acute  Fever of 100F or higher   Following upper endoscopy (EGD)  Vomiting of blood or coffee ground material  New chest pain or pain under the shoulder blades  Painful or persistently difficult swallowing  New shortness of breath  Fever of 100F or higher  Black, tarry-looking stools  For urgent or emergent issues, a  gastroenterologist can be reached at any hour by calling [phone].   DIET: Your first meal following the procedure should be a small meal and then it is ok to progress to your normal diet. Heavy or fried foods are harder to digest and may make you feel nauseous or bloated.  Likewise, meals heavy in dairy and vegetables can increase bloating.  Drink plenty of fluids but you should avoid alcoholic beverages for 24 hours.  ACTIVITY:  You should plan to take it easy for the rest of today and you should NOT DRIVE or use heavy machinery until tomorrow (because of the sedation medicines used during the test).    FOLLOW UP: Our staff will call the number listed on your records the next business day following your procedure to check on you and address any questions or concerns that you may have regarding the information given to you following your procedure. If we do not reach you, we will leave a message.  However, if you are feeling well and you are not experiencing any problems, there is no need to return our call.  We will assume that you have returned to your regular daily activities without incident.  If any biopsies were taken you will be contacted by phone or by letter within the next 1-3 weeks.  Please call us at [phone] if you have not heard about the biopsies in 3 weeks.    SIGNATURES/CONFIDENTIALITY: You and/or your care partner have signed paperwork which will be entered into your electronic medical record.  These signatures attest to the fact that that the information above on your After Visit Summary has been reviewed and is understood.  Full responsibility of the confidentiality of this discharge information lies with you and/or your care-partner.

## 2015-05-26 NOTE — Op Note (Signed)
[place]  [address]. [place], [id number]   COLONOSCOPY PROCEDURE REPORT  PATIENT: Maureen, Underwood  MR#: [id number] BIRTHDATE: 11-09-1975 , 39  yrs. old GENDER: female ENDOSCOPIST: Yetta Flock, MD REFERRED BY: PROCEDURE DATE:  05/26/2015 PROCEDURE:   Colonoscopy, diagnostic, Colonoscopy with snare polypectomy, and Colonoscopy with biopsy  ASA CLASS:   Class II INDICATIONS:Clinically significant diarrhea of unexplained origin and Colorectal Neoplasm Risk Assessment for this procedure is average risk. MEDICATIONS: Propofol 250 mg IV  DESCRIPTION OF PROCEDURE:   After the risks benefits and alternatives of the procedure were thoroughly explained, informed consent was obtained.  The digital rectal exam revealed no abnormalities of the rectum.   The LB [id number]  endoscope was introduced through the anus and advanced to the terminal ileum which was intubated for a short distance. No adverse events experienced.   The quality of the prep was adequate  The instrument was then slowly withdrawn as the colon was fully examined. Estimated blood loss is zero unless otherwise noted in this procedure report.    COLON FINDINGS: A roughly 10mm sessile polyp was noted in the ascending colon in close proximity to the IC valve.  Attempts to remove via cold snare were not successful (snare would not cut through the tissue) and it was removed via hot snare.  Three additional sessile polyps were noted in the transverse colon roughly 7-16mm in size each, again cold snare would not cut through the tissue thus were removed with hot snare without difficulty.  A 58mm sessile polyp was noted in the sigmoid colon and removed with cold snare.  A 20mm sessile polyp noted in the sigmoid colon was removed with cold forceps.  The colonic mucosa appeared normal without inflammatory changes.  Random biopsies taken of the right and left colon to rule out microscopic colitis.   The ileum was normal. Mild diverticulosis was noted in the left colon. Retroflexed views revealed internal hemorrhoids. The time to cecum = 13.3 (including polypectomies)  Withdrawal time = 14.5   The scope was withdrawn and the procedure completed. COMPLICATIONS: There were no immediate complications.    ENDOSCOPIC IMPRESSION:  6 polyps noted as above, removed Normal ileum Mild left sided diverticulosis Normal colonic mucosa otherwise, random biopsies obtained Internal hemorrhoids  RECOMMENDATIONS: No aspirin or NSAIDS for 2 weeks post polypectomy Await pathology results Resume diet Resum medication  eSigned:  Yetta Flock, MD 05/26/2015 3:27 PM   cc:  The patient   PATIENT NAME:  Maureen Underwood, Maureen Underwood MR#: [id number]

## 2015-05-26 NOTE — Progress Notes (Signed)
Called to room to assist during endoscopic procedure.  Patient ID and intended procedure confirmed with present staff. Received instructions for my participation in the procedure from the performing physician.  

## 2015-05-27 ENCOUNTER — Telehealth: Payer: Self-pay

## 2015-05-27 NOTE — Telephone Encounter (Signed)
  Follow up Call-  Call back number 05/26/2015  Post procedure Call Back phone  # [phone]  Permission to leave phone message Yes     Patient questions:  Do you have a fever, pain , or abdominal swelling? No. Pain Score  0 *  Have you tolerated food without any problems? Yes.    Have you been able to return to your normal activities? Yes.    Do you have any questions about your discharge instructions: Diet   No. Medications  No. Follow up visit  No.  Do you have questions or concerns about your Care? No.  Actions: * If pain score is 4 or above: No action needed, pain <4.

## 2015-06-16 MED FILL — ?SERTRALINE HCL 50 MG TABLE: 50 | 30 days supply | Qty: 30 | Fill #3

## 2015-06-16 MED FILL — ?OMEPRAZOLE DR 20 MG CAPSUL: 20 | 30 days supply | Qty: 60 | Fill #2

## 2015-07-02 ENCOUNTER — Telehealth: Payer: Self-pay

## 2015-07-02 NOTE — Telephone Encounter (Signed)
Patient called and left message on voice mail Patient has another abscess to the back of her leg and  Requesting a RX for bactrim which was prescribed in the past Also patient stated her Zoloft was increased to one and one half tablets She needs a new prescription to reflect this sent to the pharmacy

## 2015-07-03 ENCOUNTER — Telehealth: Payer: Self-pay

## 2015-07-03 ENCOUNTER — Other Ambulatory Visit: Payer: Self-pay | Admitting: Internal Medicine

## 2015-07-03 DIAGNOSIS — F32A Depression, unspecified: Secondary | ICD-10-CM

## 2015-07-03 DIAGNOSIS — F329 Major depressive disorder, single episode, unspecified: Secondary | ICD-10-CM

## 2015-07-03 MED ORDER — SERTRALINE HCL 50 MG PO TABS: 75.0000 mg | ORAL_TABLET | Freq: Every day | ORAL | Status: DC

## 2015-07-03 NOTE — Telephone Encounter (Signed)
Returned patient phone call Patient not available Message left on voice mail to return our call 

## 2015-07-03 NOTE — Telephone Encounter (Signed)
zoloft refilled. She needs to be seen for antibiotic

## 2015-07-04 ENCOUNTER — Telehealth: Payer: Self-pay

## 2015-07-04 NOTE — Telephone Encounter (Signed)
Returned patient phone call Patient is aware her provider did sent a new prescription for her  zoloft Patient stated her abscess has since broke and patient has an appt next Week to be seen

## 2015-07-10 ENCOUNTER — Other Ambulatory Visit: Payer: Self-pay | Admitting: Internal Medicine

## 2015-07-14 MED FILL — ?OMEPRAZOLE DR 20 MG CAPSUL: 20 | 30 days supply | Qty: 60 | Fill #3

## 2015-07-14 MED FILL — ?SERTRALINE HCL 50 MG TABLE: 50 | 30 days supply | Qty: 45 | Fill #0

## 2015-07-17 ENCOUNTER — Other Ambulatory Visit: Payer: Self-pay | Admitting: Internal Medicine

## 2015-07-28 ENCOUNTER — Other Ambulatory Visit: Payer: Self-pay | Admitting: Internal Medicine

## 2015-08-08 ENCOUNTER — Other Ambulatory Visit: Payer: Self-pay | Admitting: Internal Medicine

## 2015-08-15 ENCOUNTER — Other Ambulatory Visit: Payer: Self-pay | Admitting: Internal Medicine

## 2015-08-15 MED FILL — ?OMEPRAZOLE DR 20 MG CAPSUL: 20 | 30 days supply | Qty: 60 | Fill #4

## 2015-08-15 MED FILL — SERTRALINE HCL 50 MG TABLET: 50 | 30 days supply | Qty: 45 | Fill #1

## 2015-10-13 MED FILL — ?SERTRALINE HCL 50 MG TABLE: 50 | 30 days supply | Qty: 45 | Fill #2

## 2015-10-13 MED FILL — ?OMEPRAZOLE DR 20 MG CAPSUL: 20 | 30 days supply | Qty: 60 | Fill #5

## 2015-10-24 ENCOUNTER — Ambulatory Visit: Payer: Self-pay | Attending: Internal Medicine

## 2015-11-04 ENCOUNTER — Encounter: Payer: Self-pay | Admitting: Internal Medicine

## 2015-11-04 ENCOUNTER — Ambulatory Visit: Payer: Self-pay | Attending: Internal Medicine | Admitting: Internal Medicine

## 2015-11-04 VITALS — BP 125/85 | HR 67 | Temp 98.0°F | Wt 208.0 lb

## 2015-11-04 DIAGNOSIS — Z131 Encounter for screening for diabetes mellitus: Secondary | ICD-10-CM

## 2015-11-04 DIAGNOSIS — M5410 Radiculopathy, site unspecified: Secondary | ICD-10-CM

## 2015-11-04 DIAGNOSIS — F419 Anxiety disorder, unspecified: Secondary | ICD-10-CM | POA: Insufficient documentation

## 2015-11-04 DIAGNOSIS — R87613 High grade squamous intraepithelial lesion on cytologic smear of cervix (HGSIL): Secondary | ICD-10-CM | POA: Insufficient documentation

## 2015-11-04 DIAGNOSIS — F329 Major depressive disorder, single episode, unspecified: Secondary | ICD-10-CM | POA: Insufficient documentation

## 2015-11-04 DIAGNOSIS — M5416 Radiculopathy, lumbar region: Secondary | ICD-10-CM

## 2015-11-04 DIAGNOSIS — K219 Gastro-esophageal reflux disease without esophagitis: Secondary | ICD-10-CM | POA: Insufficient documentation

## 2015-11-04 DIAGNOSIS — F32A Depression, unspecified: Secondary | ICD-10-CM

## 2015-11-04 DIAGNOSIS — Z880 Allergy status to penicillin: Secondary | ICD-10-CM | POA: Insufficient documentation

## 2015-11-04 DIAGNOSIS — E785 Hyperlipidemia, unspecified: Secondary | ICD-10-CM | POA: Insufficient documentation

## 2015-11-04 DIAGNOSIS — Z1329 Encounter for screening for other suspected endocrine disorder: Secondary | ICD-10-CM

## 2015-11-04 DIAGNOSIS — Z720 Tobacco use: Secondary | ICD-10-CM

## 2015-11-04 DIAGNOSIS — F17200 Nicotine dependence, unspecified, uncomplicated: Secondary | ICD-10-CM | POA: Insufficient documentation

## 2015-11-04 DIAGNOSIS — G8929 Other chronic pain: Secondary | ICD-10-CM | POA: Insufficient documentation

## 2015-11-04 DIAGNOSIS — Z79899 Other long term (current) drug therapy: Secondary | ICD-10-CM | POA: Insufficient documentation

## 2015-11-04 DIAGNOSIS — M4120 Other idiopathic scoliosis, site unspecified: Secondary | ICD-10-CM

## 2015-11-04 LAB — CBC WITH DIFFERENTIAL/PLATELET
BASOS PCT: 1 %
Basophils Absolute: 89 cells/uL (ref 0–200)
EOS PCT: 4 %
Eosinophils Absolute: 356 cells/uL (ref 15–500)
HEMATOCRIT: 45.7 % — AB (ref 35.0–45.0)
Hemoglobin: 15.5 g/dL (ref 11.7–15.5)
LYMPHS PCT: 37 %
Lymphs Abs: 3293 cells/uL (ref 850–3900)
MCH: 31.3 pg (ref 27.0–33.0)
MCHC: 33.9 g/dL (ref 32.0–36.0)
MCV: 92.1 fL (ref 80.0–100.0)
MONO ABS: 801 {cells}/uL (ref 200–950)
MONOS PCT: 9 %
MPV: 11.2 fL (ref 7.5–12.5)
NEUTROS PCT: 49 %
Neutro Abs: 4361 cells/uL (ref 1500–7800)
PLATELETS: 318 10*3/uL (ref 140–400)
RBC: 4.96 MIL/uL (ref 3.80–5.10)
RDW: 13 % (ref 11.0–15.0)
WBC: 8.9 10*3/uL (ref 3.8–10.8)

## 2015-11-04 LAB — TSH: TSH: 2.74 m[IU]/L

## 2015-11-04 MED ORDER — SERTRALINE HCL 50 MG PO TABS: 75.0000 mg | ORAL_TABLET | Freq: Every day | ORAL | Status: DC

## 2015-11-04 MED ORDER — TRAMADOL HCL 50 MG PO TABS: 50.0000 mg | ORAL_TABLET | Freq: Four times a day (QID) | ORAL | Status: DC | PRN
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
## 2015-11-04 NOTE — Patient Instructions (Signed)
Back Pain, Adult °Back pain is very common in adults. The cause of back pain is rarely dangerous and the pain often gets better over time. The cause of your back pain may not be known. Some common causes of back pain include: °1. Strain of the muscles or ligaments supporting the spine. °2. Wear and tear (degeneration) of the spinal disks. °3. Arthritis. °4. Direct injury to the back. °For many people, back pain may return. Since back pain is rarely dangerous, most people can learn to manage this condition on their own. °HOME CARE INSTRUCTIONS °Watch your back pain for any changes. The following actions may help to lessen any discomfort you are feeling: °1. Remain active. It is stressful on your back to sit or stand in one place for long periods of time. Do not sit, drive, or stand in one place for more than 30 minutes at a time. Take short walks on even surfaces as soon as you are able. Try to increase the length of time you walk each day. °2. Exercise regularly as directed by your health care provider. Exercise helps your back heal faster. It also helps avoid future injury by keeping your muscles strong and flexible. °3. Do not stay in bed. Resting more than 1-2 days can delay your recovery. °4. Pay attention to your body when you bend and lift. The most comfortable positions are those that put less stress on your recovering back. Always use proper lifting techniques, including: °1. Bending your knees. °2. Keeping the load close to your body. °3. Avoiding twisting. °5. Find a comfortable position to sleep. Use a firm mattress and lie on your side with your knees slightly bent. If you lie on your back, put a pillow under your knees. °6. Avoid feeling anxious or stressed. Stress increases muscle tension and can worsen back pain. It is important to recognize when you are anxious or stressed and learn ways to manage it, such as with exercise. °7. Take medicines only as directed by your health care provider.  Over-the-counter medicines to reduce pain and inflammation are often the most helpful. Your health care provider may prescribe muscle relaxant drugs. These medicines help dull your pain so you can more quickly return to your normal activities and healthy exercise. °8. Apply ice to the injured area: °1. Put ice in a plastic bag. °2. Place a towel between your skin and the bag. °3. Leave the ice on for 20 minutes, 2-3 times a day for the first 2-3 days. After that, ice and heat may be alternated to reduce pain and spasms. °9. Maintain a healthy weight. Excess weight puts extra stress on your back and makes it difficult to maintain good posture. °SEEK MEDICAL CARE IF: °1. You have pain that is not relieved with rest or medicine. °2. You have increasing pain going down into the legs or buttocks. °3. You have pain that does not improve in one week. °4. You have night pain. °5. You lose weight. °6. You have a fever or chills. °SEEK IMMEDIATE MEDICAL CARE IF:  °1. You develop new bowel or bladder control problems. °2. You have unusual weakness or numbness in your arms or legs. °3. You develop nausea or vomiting. °4. You develop abdominal pain. °5. You feel faint. °  °This information is not intended to replace advice given to you by your health care provider. Make sure you discuss any questions you have with your health care provider. °  °Document Released: 04/26/2005 Document Revised: 05/17/2014 Document Reviewed: 08/28/2013 °Elsevier Interactive Patient Education ©2016 Elsevier   Inc. ° °Back Exercises °If you have pain in your back, do these exercises 2-3 times each day or as told by your doctor. When the pain goes away, do the exercises once each day, but repeat the steps more times for each exercise (do more repetitions). If you do not have pain in your back, do these exercises once each day or as told by your doctor. °EXERCISES °Single Knee to Chest °Do these steps 3-5 times in a row for each leg: °5. Lie on your back  on a firm bed or the floor with your legs stretched out. °6. Bring one knee to your chest. °7. Hold your knee to your chest by grabbing your knee or thigh. °8. Pull on your knee until you feel a gentle stretch in your lower back. °9. Keep doing the stretch for 10-30 seconds. °10. Slowly let go of your leg and straighten it. °Pelvic Tilt °Do these steps 5-10 times in a row: °10. Lie on your back on a firm bed or the floor with your legs stretched out. °11. Bend your knees so they point up to the ceiling. Your feet should be flat on the floor. °12. Tighten your lower belly (abdomen) muscles to press your lower back against the floor. This will make your tailbone point up to the ceiling instead of pointing down to your feet or the floor. °13. Stay in this position for 5-10 seconds while you gently tighten your muscles and breathe evenly. °Cat-Cow °Do these steps until your lower back bends more easily: °7. Get on your hands and knees on a firm surface. Keep your hands under your shoulders, and keep your knees under your hips. You may put padding under your knees. °8. Let your head hang down, and make your tailbone point down to the floor so your lower back is round like the back of a cat. °9. Stay in this position for 5 seconds. °10. Slowly lift your head and make your tailbone point up to the ceiling so your back hangs low (sags) like the back of a cow. °11. Stay in this position for 5 seconds. °Press-Ups °Do these steps 5-10 times in a row: °6. Lie on your belly (face-down) on the floor. °7. Place your hands near your head, about shoulder-width apart. °8. While you keep your back relaxed and keep your hips on the floor, slowly straighten your arms to raise the top half of your body and lift your shoulders. Do not use your back muscles. To make yourself more comfortable, you may change where you place your hands. °9. Stay in this position for 5 seconds. °10. Slowly return to lying flat on the floor. °Bridges °Do these  steps 10 times in a row: °1. Lie on your back on a firm surface. °2. Bend your knees so they point up to the ceiling. Your feet should be flat on the floor. °3. Tighten your butt muscles and lift your butt off of the floor until your waist is almost as high as your knees. If you do not feel the muscles working in your butt and the back of your thighs, slide your feet 1-2 inches farther away from your butt. °4. Stay in this position for 3-5 seconds. °5. Slowly lower your butt to the floor, and let your butt muscles relax. °If this exercise is too easy, try doing it with your arms crossed over your chest. °Belly Crunches °Do these steps 5-10 times in a row: °1. Lie on your back on   a firm bed or the floor with your legs stretched out. °2. Bend your knees so they point up to the ceiling. Your feet should be flat on the floor. °3. Cross your arms over your chest. °4. Tip your chin a little bit toward your chest but do not bend your neck. °5. Tighten your belly muscles and slowly raise your chest just enough to lift your shoulder blades a tiny bit off of the floor. °6. Slowly lower your chest and your head to the floor. °Back Lifts °Do these steps 5-10 times in a row: °1. Lie on your belly (face-down) with your arms at your sides, and rest your forehead on the floor. °2. Tighten the muscles in your legs and your butt. °3. Slowly lift your chest off of the floor while you keep your hips on the floor. Keep the back of your head in line with the curve in your back. Look at the floor while you do this. °4. Stay in this position for 3-5 seconds. °5. Slowly lower your chest and your face to the floor. °GET HELP IF: °· Your back pain gets a lot worse when you do an exercise. °· Your back pain does not lessen 2 hours after you exercise. °If you have any of these problems, stop doing the exercises. Do not do them again unless your doctor says it is okay. °GET HELP RIGHT AWAY IF: °· You have sudden, very bad back pain. If this  happens, stop doing the exercises. Do not do them again unless your doctor says it is okay. °  °This information is not intended to replace advice given to you by your health care provider. Make sure you discuss any questions you have with your health care provider. °  °Document Released: 05/29/2010 Document Revised: 01/15/2015 Document Reviewed: 06/20/2014 °Elsevier Interactive Patient Education ©2016 Elsevier Inc. ° °

## 2015-11-05 LAB — BASIC METABOLIC PANEL WITH GFR
BUN: 6 mg/dL — ABNORMAL LOW (ref 7–25)
CALCIUM: 9.4 mg/dL (ref 8.6–10.2)
CHLORIDE: 105 mmol/L (ref 98–110)
CO2: 24 mmol/L (ref 20–31)
CREATININE: 1.06 mg/dL (ref 0.50–1.10)
GFR, Est African American: 78 mL/min (ref >=60)
GFR, Est Non African American: 67 mL/min (ref >=60)
Glucose, Bld: 86 mg/dL (ref 65–99)
Potassium: 4.6 mmol/L (ref 3.5–5.3)
SODIUM: 139 mmol/L (ref 135–146)

## 2015-11-12 ENCOUNTER — Telehealth: Payer: Self-pay | Admitting: Internal Medicine

## 2015-11-12 MED FILL — ?OMEPRAZOLE DR 20 MG CAPSUL: 20 | 30 days supply | Qty: 60 | Fill #6

## 2015-11-12 MED FILL — SERTRALINE HCL 50 MG TABLET: 50 | 30 days supply | Qty: 45 | Fill #3

## 2015-11-12 NOTE — Telephone Encounter (Signed)
Patient called requesting lab results. °Please follow up. °

## 2015-11-13 NOTE — Telephone Encounter (Signed)
Pt. Called requesting her lab results. Please f/u with pt.  °

## 2015-11-14 MED FILL — traMADol HCL 50 MG TABS: 50 | 11 days supply | Qty: 45 | Fill #0

## 2015-11-14 NOTE — Telephone Encounter (Signed)
Refer to lab results note.

## 2015-12-11 ENCOUNTER — Ambulatory Visit (INDEPENDENT_AMBULATORY_CARE_PROVIDER_SITE_OTHER): Payer: Self-pay | Admitting: Obstetrics & Gynecology

## 2015-12-11 ENCOUNTER — Encounter: Payer: Self-pay | Admitting: Obstetrics & Gynecology

## 2015-12-11 ENCOUNTER — Ambulatory Visit (INDEPENDENT_AMBULATORY_CARE_PROVIDER_SITE_OTHER): Payer: Self-pay | Admitting: Clinical

## 2015-12-11 VITALS — BP 126/82 | HR 60 | Ht 66.0 in | Wt 207.2 lb

## 2015-12-11 DIAGNOSIS — R102 Pelvic and perineal pain: Secondary | ICD-10-CM

## 2015-12-11 DIAGNOSIS — R87613 High grade squamous intraepithelial lesion on cytologic smear of cervix (HGSIL): Secondary | ICD-10-CM

## 2015-12-11 DIAGNOSIS — F411 Generalized anxiety disorder: Secondary | ICD-10-CM

## 2015-12-11 NOTE — Progress Notes (Signed)
Declines counselor today, states has appointment at [facility] to see a therapist

## 2015-12-11 NOTE — Patient Instructions (Signed)

## 2015-12-11 NOTE — Progress Notes (Signed)
Patient ID: Maureen Underwood, female   DOB: 1975/06/26, 40 y.o.   MRN: [id number]  Chief Complaint  Patient presents with  . Vaginal Discharge  f/u from LEEP  HPI Maureen Underwood is a 40 y.o. female.  [id number] Patient's last menstrual period was 05/13/2015. Had normal menses for 3 months after Leep last year then no menses for 3 months followed by abnormal dark bleeding and discharge and pelvic pain and dyspareunia, no bleeding now. HPI  Past Medical History:  Diagnosis Date  . Anxiety   . Degenerative disc disease, lumbar   . Diarrhea   . GERD (gastroesophageal reflux disease)   . Scoliosis    with associated back pain    Past Surgical History:  Procedure Laterality Date  . CERVICAL BIOPSY  W/ LOOP ELECTRODE EXCISION    . DILATION AND CURETTAGE OF UTERUS     2005  . poly rectal cyst      Family History  Problem Relation Age of Onset  . Diabetes Mother   . Heart disease Paternal Grandfather   . Hypertension Paternal Grandfather   . Cancer Paternal Grandfather     Social History Social History  Substance Use Topics  . Smoking status: Current Every Day Smoker    Types: Cigarettes  . Smokeless tobacco: Never Used  . Alcohol use No    Allergies  Allergen Reactions  . Penicillins Hives    Current Outpatient Prescriptions  Medication Sig Dispense Refill  . dicyclomine (BENTYL) 10 MG capsule Take 1 capsule (10 mg total) by mouth every 8 (eight) hours as needed for spasms. 60 capsule 1  . omeprazole (PRILOSEC) 20 MG capsule Take 1 capsule (20 mg total) by mouth 2 (two) times daily before a meal. 60 capsule 6  . sertraline (ZOLOFT) 50 MG tablet Take 1.5 tablets (75 mg total) by mouth daily. 45 tablet 5  . traMADol (ULTRAM) 50 MG tablet Take 1 tablet (50 mg total) by mouth every 6 (six) hours as needed. Reported on 11/04/2015 45 tablet 0  . triamcinolone (KENALOG) 0.025 % ointment Apply 1 application topically 2 (two) times daily. 30 g 0  . methylPREDNISolone (MEDROL  DOSEPAK) 4 MG TBPK tablet   0   No current facility-administered medications for this visit.     Review of Systems Review of Systems  Constitutional: Negative.   Respiratory: Negative.   Gastrointestinal: Positive for abdominal pain.  Genitourinary: Positive for dyspareunia, pelvic pain and vaginal discharge. Negative for difficulty urinating, frequency and vaginal bleeding.    Blood pressure 126/82, pulse 60, height 5\' 6"  (1.676 m), weight 207 lb 3.2 oz (94 kg), last menstrual period 05/13/2015.  Physical Exam Physical Exam  Constitutional: She is oriented to person, place, and time. She appears well-developed. No distress.  Cardiovascular: Normal rate.   Pulmonary/Chest: Effort normal. No respiratory distress.  Abdominal: Soft. She exhibits no mass. There is no tenderness.  Genitourinary: Uterus normal. Vaginal discharge (scant dark blood and mucus) found.  Genitourinary Comments: Passed cervical dilator doubt stenosis  Neurological: She is alert and oriented to person, place, and time.  Skin: Skin is warm and dry.  Psychiatric: She has a normal mood and affect. Her behavior is normal.    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

## 2015-12-11 NOTE — Progress Notes (Signed)
  ASSESSMENT: Pt currently experiencing Generalized anxiety disorder, along with symptoms of depression. Pt needs to f/u with PCP and establish care with psychiatry. Pt would benefit from psychoeducation and brief therapeutic interventions regarding coping with symptoms of anxiety and depression. Pt may benefit from additional community resources. Stage of Change: contemplative  PLAN: 1. F/U with behavioral health clinician as needed 2. Psychiatric Medications: Zoloft (may change to other BH meds at [facility] appt in 2 weeks) 3. Behavioral recommendations:   -Attend [facility] appointment with psychiatry on 12-19-15 -Consider reading educational material regarding coping with symptoms of anxiety and depression -Consider looking into [employer] Program for husband, as possible resource  SUBJECTIVE: Pt. referred by Emeterio Reeve, MD,  for symptoms of anxiety and depression Pt. reports the following symptoms/concerns: Pt states that she has been referred to [facility] through her PCP, as her current [place] meds are "not working". Pt experiencing many psychosocial stressors, including family conflict and incarceration of husband that have increased feelings of anxiety.  Duration of problem: Over one year Severity: moderate(depressive), severe(anxiety)   OBJECTIVE: Orientation & Cognition: Oriented x3. Thought processes normal and appropriate to situation. Mood: appropriate Affect: appropriate Appearance: appropriate Risk of harm to self or others: no known risk of harm to self or others Substance use: tobacco Assessments administered: PHQ9: 17/ GAD7: 18  Diagnosis: Generalized anxiety disorder  CPT Code: F41.1  -------------------------------------------- Other(s) present in the room: none  Time spent with patient in exam room: 20 minutes 3:15 to 3:35  Depression screen [facility] 2/9 12/11/2015 11/04/2015 03/19/2015 01/09/2015 11/27/2014  Decreased Interest 2 1 1  0 2  Down, Depressed, Hopeless 2 2 1  0  3  PHQ - 2 Score 4 3 2  0 5  Altered sleeping 3 1 0 0 2  Tired, decreased energy 2 1 0 0 1  Change in appetite 2 1 0 0 2  Feeling bad or failure about yourself  2 1 0 1 3  Trouble concentrating 2 1 0 0 1  Moving slowly or fidgety/restless 2 1 1  0 2  Suicidal thoughts 0 1 1 0 2  PHQ-9 Score 17 10 4 1 18   Difficult doing work/chores - Very difficult - - -   GAD 7 : Generalized Anxiety Score 12/11/2015 11/04/2015  Nervous, Anxious, on Edge 3 2  Control/stop worrying 3 3  Worry too much - different things 3 3  Trouble relaxing 3 2  Restless 2 1  Easily annoyed or irritable 3 3  Afraid - awful might happen 1 3  Total GAD 7 Score 18 17  Anxiety Difficulty - Very difficult

## 2015-12-11 NOTE — Progress Notes (Signed)
C/ostarting about a month ago having  blackish vaginal discharge, then changed to bright red like water then changed to blood clots then dark blood then back to black .  Currently no discharge. Went to medical doctor who sent her back to our office.

## 2015-12-12 LAB — CYTOLOGY - PAP

## 2015-12-15 ENCOUNTER — Encounter: Payer: Self-pay | Admitting: Gastroenterology

## 2015-12-15 ENCOUNTER — Ambulatory Visit (INDEPENDENT_AMBULATORY_CARE_PROVIDER_SITE_OTHER): Payer: Self-pay | Admitting: Gastroenterology

## 2015-12-15 VITALS — BP 104/72 | HR 64 | Ht 65.35 in | Wt 209.4 lb

## 2015-12-15 DIAGNOSIS — K589 Irritable bowel syndrome without diarrhea: Secondary | ICD-10-CM

## 2015-12-15 DIAGNOSIS — Z86010 Personal history of colonic polyps: Secondary | ICD-10-CM

## 2015-12-15 DIAGNOSIS — R1013 Epigastric pain: Secondary | ICD-10-CM

## 2015-12-15 MED ORDER — RIFAXIMIN 550 MG PO TABS: 550.0000 mg | ORAL_TABLET | Freq: Three times a day (TID) | ORAL | 0 refills | Status: DC

## 2015-12-15 NOTE — Patient Instructions (Signed)
You have been scheduled for an abdominal ultrasound-limited at [facility] (1st floor of hospital) on Friday, 12/19/15 at 8:00 am. Please arrive 15 minutes prior to your appointment for registration. Make certain not to have anything to eat or drink 6 hours prior to your appointment. Should you need to reschedule your appointment, please contact radiology at [phone]. This test typically takes about 30 minutes to perform.  Please purchase the following medications over the counter and take as directed: FD Guard- Take 2 capsules by mouth twice daily Imodium-As needed for diarrhea  We have given you samples of Xifaxan 550 mg to take 1 tablet three times daily x 14 days.  You will be due for a recall colonoscopy in 05/2017. We will send you a reminder in the mail when it gets closer to that time.  If you are age 24 or older, your body mass index should be between 23-30. Your Body mass index is 34.47 kg/m. If this is out of the aforementioned range listed, please consider follow up with your Primary Care Provider.  If you are age 47 or younger, your body mass index should be between 19-25. Your Body mass index is 34.47 kg/m. If this is out of the aformentioned range listed, please consider follow up with your Primary Care Provider.

## 2015-12-15 NOTE — Progress Notes (Signed)
HPI :  INTAKE HISTORY: 03/27/15 40 y/o female seen in consultation for bowel habit changes and abdominal pains from Chari Manning NP   Patient reports she has had symptoms for the past year, but worsening over the past 7 months. She thought she initially had a lactose deficiency problem and cut back on dairy foodd which did not make a difference at all. She reports she eats and can have a sharp pain in her LLQ with significant urgency to have a bowel movement, with diarrhea. She thinks within 5 minutes of eating she has the urge to have a bowel movement, it is hard to get to a bathroom in time. She has roughly on average 8-9 BMs per day. No blood in the stools. She has had some change in stool color but no blood appreciated. She reports mostly loose stools, rarely constipated stools. She reports the pain is relived with the BM. She reports she is gaining weight, no weight loss. She is going to start phentermine soon for weight loss. She has rare nocturnal symptoms.  She reports she has also had some postprandial epigastric pain associated with some vomiting. She thinks prilosec has significantly helped with this symptom. She reports this has been ongoing for roughly 1-2 years or so, perhaps longer. She reports this occurs daily if she does not take anything. She thinks after roughly 20-30 minutes of eating she will feel the discomfort, but rarely it can occur while she eats. She vomits which will relieve the symptom and then she will feel better. The pain is mostly in the epigastric, pain does not radiate. The pain will take several hours to resolve if she does not vomit. Prilosec has helped significantly with reducing the frequency but it still occurs to some extent. She takes 20mg  once daily of Prilosec. She has some heartburn at baseline, she does not think she has much breakthrough on this regimen. No dysphagia. She reports she takes tylenol PRN For pain, as well as tramadol for back pain. She denies  other NSAID use. She has been on zoloft for the past 4 months, symptoms have preceeded its use.   No FH of IBD, celiac, or CRC. Grandfather had pancreatic cancer.   She reports in 2010 she had severe vomiting, associated with hematemesis. She reports she was evaluated at that time and told she had "ulcers" but did not have an EGD.   SINCE LAST VISIT:  EGD in Jan 2017 showed a benign hyperplastic gastric polyp. Biopsies for H pylori and celiac were negative Colonoscopy same day showed normal colon without microscopic colitis or inflammatory changes and normal ileum. She incidentally had multple polyps removed, 4 polyps from the right colon removed, roughly 80mm in size, most of these are sessile serrated (pre-cancerous), and she also had some hyperplastic polyps. Otherwise, I did not see anything on endoscopy to account for her symptoms. I had previously given her prilosec, recommended a low FODMOP diet, and trial of bentyl.  She reports her stool frequency is better since I have seen her, around 5-6 BMs per day. She has a lot of urgency with her stools after she eats. She reports most every food will do this for her, and she has a hard time eating out in restaurants due to this. She has had a trial of low FODMOP diet which did not help her too much. She has a lot of bloating which continues to bother her. She continues to have some occasional epigastric discomfort. This can occur  after she eats, she thinks lettuce usually will cause this. Epigaastric pain, no radiation. She has this roughly once per week. She thinks it has improved since higher dose omeprazole but not as bad. She thinks pain within 10-15 minutes of eating and can last a "long time". Sometimes she has vomiting with this to make her feel better. She otherwise has some lower abdominal cramping associated with the urge to have a bowel movement and is reliably relived with a bowel movement.    Past Medical History:  Diagnosis Date  .  Anxiety   . Degenerative disc disease, lumbar   . Diarrhea   . GERD (gastroesophageal reflux disease)   . Scoliosis    with associated back pain     Past Surgical History:  Procedure Laterality Date  . CERVICAL BIOPSY  W/ LOOP ELECTRODE EXCISION    . DILATION AND CURETTAGE OF UTERUS     2005  . poly rectal cyst     Family History  Problem Relation Age of Onset  . Diabetes Mother   . Heart disease Paternal Grandfather   . Hypertension Paternal Grandfather   . Cancer Paternal Grandfather    Social History  Substance Use Topics  . Smoking status: Current Every Day Smoker    Types: Cigarettes  . Smokeless tobacco: Never Used  . Alcohol use No   Current Outpatient Prescriptions  Medication Sig Dispense Refill  . dicyclomine (BENTYL) 10 MG capsule Take 1 capsule (10 mg total) by mouth every 8 (eight) hours as needed for spasms. 60 capsule 1  . methylPREDNISolone (MEDROL DOSEPAK) 4 MG TBPK tablet   0  . omeprazole (PRILOSEC) 20 MG capsule Take 1 capsule (20 mg total) by mouth 2 (two) times daily before a meal. 60 capsule 6  . sertraline (ZOLOFT) 50 MG tablet Take 1.5 tablets (75 mg total) by mouth daily. 45 tablet 5  . traMADol (ULTRAM) 50 MG tablet Take 1 tablet (50 mg total) by mouth every 6 (six) hours as needed. Reported on 11/04/2015 45 tablet 0  . triamcinolone (KENALOG) 0.025 % ointment Apply 1 application topically 2 (two) times daily. 30 g 0   No current facility-administered medications for this visit.    Allergies  Allergen Reactions  . Penicillins Hives     Review of Systems: All systems reviewed and negative except where noted in HPI.   Lab Results  Component Value Date   WBC 8.9 11/04/2015   HGB 15.5 11/04/2015   HCT 45.7 (H) 11/04/2015   MCV 92.1 11/04/2015   PLT 318 11/04/2015   Lab Results  Component Value Date   ALT 21 03/19/2015   AST 17 03/19/2015   ALKPHOS 62 03/19/2015   BILITOT 0.5 03/19/2015    Lab Results  Component Value Date    CREATININE 1.06 11/04/2015   BUN 6 (L) 11/04/2015   NA 139 11/04/2015   K 4.6 11/04/2015   CL 105 11/04/2015   CO2 24 11/04/2015     Physical Exam: Ht 5' 5.35" (1.66 m) Comment: height measured without shoes  Wt 209 lb 6 oz (95 kg)   LMP 05/13/2015   BMI 34.47 kg/m  Constitutional: Pleasant,well-developed, female in no acute distress. HEENT: Normocephalic and atraumatic. Conjunctivae are normal. No scleral icterus. Neck supple.  Cardiovascular: Normal rate, regular rhythm.  Pulmonary/chest: Effort normal and breath sounds normal. No wheezing, rales or rhonchi. Abdominal: Soft, nondistended, nontender. Bowel sounds active throughout. There are no masses palpable.   Extremities: no edema Lymphadenopathy:  No cervical adenopathy noted. Neurological: Alert and oriented to person place and time. Skin: Skin is warm and dry. No rashes noted. Psychiatric: Normal mood and affect. Behavior is normal.   ASSESSMENT AND PLAN: 40 y/o female here to be reassessed for the following issues:  IBS-D - I suspect she more than likely has IBS-D, meets criteria for this, with no anemia, negative celiac testing, negative endoscopy with biopsies of the small bowel and colon without microscopic colitis. Tried low FODMOP diet without benefit. I discussed what IBS is and discussed numerous options for therapy with her. She was interested in trying rifaximin to hopefully avoid taking medication daily for this. I was able to obtain a free sample for her for 2 weeks, take 550mg  TID. She can otherwise take immodium PRN. If no improvement, we can try a course of colestid or viberzi. She agreed and will call back in a month if no improvement.   Dyspepsia - negative EGD, H pylori negative, celiac negative. Suspect functional dyspepsia however biliary colic is possible. I offered her an US of the RUQ to assess for gallstones. Otherwise I will try her on a course of FD gard and see if this helps. I will contact her with  results.   Colon polyps - multiple, (4), moderate sized sessile serrated polyps in the right colon noted incidentally on colonoscopy. Guidelines recommend a 3 year follow up for this finding, however given her age of this finding we may want to do this a bit sooner, in two years. She agreed, recall Jan 2019.   [place] Cellar, MD [facility] Pager [phone]

## 2015-12-17 ENCOUNTER — Ambulatory Visit (HOSPITAL_COMMUNITY)
Admission: RE | Admit: 2015-12-17 | Discharge: 2015-12-17 | Disposition: A | Discharge status: Home / Self Care | Payer: Self-pay | Source: Ambulatory Visit | Attending: Obstetrics & Gynecology | Admitting: Obstetrics & Gynecology

## 2015-12-17 DIAGNOSIS — E282 Polycystic ovarian syndrome: Secondary | ICD-10-CM | POA: Insufficient documentation

## 2015-12-17 DIAGNOSIS — R102 Pelvic and perineal pain: Secondary | ICD-10-CM | POA: Insufficient documentation

## 2015-12-18 ENCOUNTER — Telehealth: Payer: Self-pay

## 2015-12-18 NOTE — Telephone Encounter (Signed)
Per Dr. Roselie Awkward, pt needs to f/u for PCOS.  LM for pt to return call in regards to f/u appt.

## 2015-12-19 ENCOUNTER — Ambulatory Visit (HOSPITAL_COMMUNITY)
Admission: RE | Admit: 2015-12-19 | Discharge: 2015-12-19 | Disposition: A | Discharge status: Home / Self Care | Payer: Self-pay | Source: Ambulatory Visit | Attending: Gastroenterology | Admitting: Gastroenterology

## 2015-12-19 ENCOUNTER — Ambulatory Visit (INDEPENDENT_AMBULATORY_CARE_PROVIDER_SITE_OTHER): Payer: No Typology Code available for payment source | Admitting: Psychiatry

## 2015-12-19 DIAGNOSIS — R1013 Epigastric pain: Secondary | ICD-10-CM | POA: Insufficient documentation

## 2015-12-19 DIAGNOSIS — K824 Cholesterolosis of gallbladder: Secondary | ICD-10-CM | POA: Insufficient documentation

## 2015-12-19 DIAGNOSIS — F321 Major depressive disorder, single episode, moderate: Secondary | ICD-10-CM

## 2015-12-19 DIAGNOSIS — K760 Fatty (change of) liver, not elsewhere classified: Secondary | ICD-10-CM | POA: Insufficient documentation

## 2015-12-19 NOTE — Progress Notes (Signed)
Maureen Underwood is a 40 y.o. female patient reporting symptoms related to anxiety and depression. Pt. Needs medication management and therapy. Pt. Is open to group therapy but would like to begin with individual sessions. Pt. Reports symptoms including irritability/low frustration tolerance, tearfulness, loss of motivation, sleep and appetite dysregulation, and problems with medication. Pt. Reports some passive suicidal ideation around April 2017 with thought that she could jump off of bridge, but has no immediate plan related. Pt. Is currently taking zoloft 75 mg, but has not noticed improvement. Pt. Reports as her most significant stressor is husband who has been incarcerated since June due to drugs and severe drug addiction.        Nancie Neas, LPC

## 2015-12-22 MED FILL — ?OMEPRAZOLE DR 20 MG CAPSUL: 20 | 30 days supply | Qty: 30 | Fill #0

## 2015-12-22 MED FILL — METHYLPREDNISOLONE 4 MG TAB: 4 | 6 days supply | Qty: 21 | Fill #0

## 2015-12-22 MED FILL — ?SERTRALINE HCL 50 MG TABLE: 50 | 30 days supply | Qty: 45 | Fill #4

## 2015-12-22 MED FILL — DICYCLOMINE 10 MG CAPSULE: 10 | 20 days supply | Qty: 60 | Fill #1

## 2015-12-23 ENCOUNTER — Other Ambulatory Visit: Payer: Self-pay

## 2015-12-23 DIAGNOSIS — K760 Fatty (change of) liver, not elsewhere classified: Secondary | ICD-10-CM

## 2015-12-23 NOTE — Telephone Encounter (Signed)
Patient has appt scheduled for 12/31/2015

## 2015-12-25 ENCOUNTER — Telehealth: Payer: Self-pay | Admitting: Gastroenterology

## 2015-12-25 NOTE — Telephone Encounter (Signed)
Spoke with patient and she states she cannot afford the $166 that [facility] charges. She wants to be referred to [facility]. Spoke with [facility] and they will call patient for financial counseling and then set up appointment. Faxed records to [phone]. Patient aware.

## 2015-12-29 ENCOUNTER — Ambulatory Visit (INDEPENDENT_AMBULATORY_CARE_PROVIDER_SITE_OTHER): Payer: Self-pay | Admitting: Licensed Clinical Social Worker

## 2015-12-29 DIAGNOSIS — F32A Depression, unspecified: Secondary | ICD-10-CM

## 2015-12-29 DIAGNOSIS — F329 Major depressive disorder, single episode, unspecified: Secondary | ICD-10-CM
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
## 2015-12-31 ENCOUNTER — Ambulatory Visit (INDEPENDENT_AMBULATORY_CARE_PROVIDER_SITE_OTHER): Payer: Self-pay | Admitting: Obstetrics & Gynecology

## 2015-12-31 ENCOUNTER — Encounter: Payer: Self-pay | Admitting: Obstetrics & Gynecology

## 2015-12-31 VITALS — BP 116/70 | HR 70 | Wt 209.5 lb

## 2015-12-31 DIAGNOSIS — Z30011 Encounter for initial prescription of contraceptive pills: Secondary | ICD-10-CM

## 2015-12-31 MED ORDER — NORGESTIM-ETH ESTRAD TRIPHASIC 0.18/0.215/0.25 MG-25 MCG PO TABS: 1.0000 | ORAL_TABLET | Freq: Every day | ORAL | 11 refills | Status: DC

## 2015-12-31 MED FILL — TRI-LO-SPRINTEC TABLET: 0.18/0.215/ | 28 days supply | Qty: 28 | Fill #0

## 2015-12-31 NOTE — Patient Instructions (Signed)
Oral Contraception Information Oral contraceptive pills (OCPs) are medicines taken to prevent pregnancy. OCPs work by preventing the ovaries from releasing eggs. The hormones in OCPs also cause the cervical mucus to thicken, preventing the sperm from entering the uterus. The hormones also cause the uterine lining to become thin, not allowing a fertilized egg to attach to the inside of the uterus. OCPs are highly effective when taken exactly as prescribed. However, OCPs do not prevent sexually transmitted diseases (STDs). Safe sex practices, such as using condoms along with the pill, can help prevent STDs.  Before taking the pill, you may have a physical exam and Pap test. Your health care provider may order blood tests. The health care provider will make sure you are a good candidate for oral contraception. Discuss with your health care provider the possible side effects of the OCP you may be prescribed. When starting an OCP, it can take 2 to 3 months for the body to adjust to the changes in hormone levels in your body.  TYPES OF ORAL CONTRACEPTION The combination pill--This pill contains estrogen and progestin (synthetic progesterone) hormones. The combination pill comes in 21-day, 28-day, or 91-day packs. Some types of combination pills are meant to be taken continuously (365-day pills). With 21-day packs, you do not take pills for 7 days after the last pill. With 28-day packs, the pill is taken every day. The last 7 pills are without hormones. Certain types of pills have more than 21 hormone-containing pills. With 91-day packs, the first 84 pills contain both hormones, and the last 7 pills contain no hormones or contain estrogen only. The minipill--This pill contains the progesterone hormone only. The pill is taken every day continuously. It is very important to take the pill at the same time each day. The minipill comes in packs of 28 pills. All 28 pills contain the hormone.  ADVANTAGES OF ORAL  CONTRACEPTIVE PILLS Decreases premenstrual symptoms.  Treats menstrual period cramps.  Regulates the menstrual cycle.  Decreases a heavy menstrual flow.  May treatacne, depending on the type of pill.  Treats abnormal uterine bleeding.  Treats polycystic ovarian syndrome.  Treats endometriosis.  Can be used as emergency contraception.  THINGS THAT CAN MAKE ORAL CONTRACEPTIVE PILLS LESS EFFECTIVE OCPs can be less effective if:  You forget to take the pill at the same time every day.  You have a stomach or intestinal disease that lessens the absorption of the pill.  You take OCPs with other medicines that make OCPs less effective, such as antibiotics, certain HIV medicines, and some seizure medicines.  You take expired OCPs.  You forget to restart the pill on day 7, when using the packs of 21 pills.  RISKS ASSOCIATED WITH ORAL CONTRACEPTIVE PILLS  Oral contraceptive pills can sometimes cause side effects, such as: Headache. Nausea. Breast tenderness. Irregular bleeding or spotting. Combination pills are also associated with a small increased risk of: Blood clots. Heart attack. Stroke.   This information is not intended to replace advice given to you by your health care provider. Make sure you discuss any questions you have with your health care provider.   Document Released: 07/17/2002 Document Revised: 02/14/2013 Document Reviewed: 10/15/2012 Elsevier Interactive Patient Education 2016 [employer]. Smoking Cessation, Tips for Success If you are ready to quit smoking, congratulations! You have chosen to help yourself be healthier. Cigarettes bring nicotine, tar, carbon monoxide, and other irritants into your body. Your lungs, heart, and blood vessels will be able to work better without  these poisons. There are many different ways to quit smoking. Nicotine gum, nicotine patches, a nicotine inhaler, or nicotine nasal spray can help with physical craving. Hypnosis, support  groups, and medicines help break the habit of smoking. WHAT THINGS CAN I DO TO MAKE QUITTING EASIER?  Here are some tips to help you quit for good:  Pick a date when you will quit smoking completely. Tell all of your friends and family about your plan to quit on that date.  Do not try to slowly cut down on the number of cigarettes you are smoking. Pick a quit date and quit smoking completely starting on that day.  Throw away all cigarettes.   Clean and remove all ashtrays from your home, work, and car.  On a card, write down your reasons for quitting. Carry the card with you and read it when you get the urge to smoke.  Cleanse your body of nicotine. Drink enough water and fluids to keep your urine clear or pale yellow. Do this after quitting to flush the nicotine from your body.  Learn to predict your moods. Do not let a bad situation be your excuse to have a cigarette. Some situations in your life might tempt you into wanting a cigarette.  Never have "just one" cigarette. It leads to wanting another and another. Remind yourself of your decision to quit.  Change habits associated with smoking. If you smoked while driving or when feeling stressed, try other activities to replace smoking. Stand up when drinking your coffee. Brush your teeth after eating. Sit in a different chair when you read the paper. Avoid alcohol while trying to quit, and try to drink fewer caffeinated beverages. Alcohol and caffeine may urge you to smoke.  Avoid foods and drinks that can trigger a desire to smoke, such as sugary or spicy foods and alcohol.  Ask people who smoke not to smoke around you.  Have something planned to do right after eating or having a cup of coffee. For example, plan to take a walk or exercise.  Try a relaxation exercise to calm you down and decrease your stress. Remember, you may be tense and nervous for the first 2 weeks after you quit, but this will pass.  Find new activities to keep  your hands busy. Play with a pen, coin, or rubber band. Doodle or draw things on paper.  Brush your teeth right after eating. This will help cut down on the craving for the taste of tobacco after meals. You can also try mouthwash.   Use oral substitutes in place of cigarettes. Try using lemon drops, carrots, cinnamon sticks, or chewing gum. Keep them handy so they are available when you have the urge to smoke.  When you have the urge to smoke, try deep breathing.  Designate your home as a nonsmoking area.  If you are a heavy smoker, ask your health care provider about a prescription for nicotine chewing gum. It can ease your withdrawal from nicotine.  Reward yourself. Set aside the cigarette money you save and buy yourself something nice.  Look for support from others. Join a support group or smoking cessation program. Ask someone at home or at work to help you with your plan to quit smoking.  Always ask yourself, "Do I need this cigarette or is this just a reflex?" Tell yourself, "Today, I choose not to smoke," or "I do not want to smoke." You are reminding yourself of your decision to quit.  Do not  replace cigarette smoking with electronic cigarettes (commonly called e-cigarettes). The safety of e-cigarettes is unknown, and some may contain harmful chemicals.  If you relapse, do not give up! Plan ahead and think about what you will do the next time you get the urge to smoke. HOW WILL I FEEL WHEN I QUIT SMOKING? You may have symptoms of withdrawal because your body is used to nicotine (the addictive substance in cigarettes). You may crave cigarettes, be irritable, feel very hungry, cough often, get headaches, or have difficulty concentrating. The withdrawal symptoms are only temporary. They are strongest when you first quit but will go away within 10-14 days. When withdrawal symptoms occur, stay in control. Think about your reasons for quitting. Remind yourself that these are signs that your  body is healing and getting used to being without cigarettes. Remember that withdrawal symptoms are easier to treat than the major diseases that smoking can cause.  Even after the withdrawal is over, expect periodic urges to smoke. However, these cravings are generally short lived and will go away whether you smoke or not. Do not smoke! WHAT RESOURCES ARE AVAILABLE TO HELP ME QUIT SMOKING? Your health care provider can direct you to community resources or hospitals for support, which may include:  Group support.  Education.  Hypnosis.  Therapy.   This information is not intended to replace advice given to you by your health care provider. Make sure you discuss any questions you have with your health care provider.   Document Released: 01/23/2004 Document Revised: 05/17/2014 Document Reviewed: 10/12/2012 Elsevier Interactive Patient Education [employer].

## 2016-01-02 ENCOUNTER — Other Ambulatory Visit: Payer: Self-pay

## 2016-01-02 ENCOUNTER — Other Ambulatory Visit (INDEPENDENT_AMBULATORY_CARE_PROVIDER_SITE_OTHER): Payer: No Typology Code available for payment source

## 2016-01-02 DIAGNOSIS — K760 Fatty (change of) liver, not elsewhere classified: Secondary | ICD-10-CM

## 2016-01-02 LAB — HEPATIC FUNCTION PANEL
ALK PHOS: 60 U/L (ref 39–117)
ALT: 16 U/L (ref 0–35)
AST: 13 U/L (ref 0–37)
Albumin: 3.7 g/dL (ref 3.5–5.2)
BILIRUBIN DIRECT: 0 mg/dL (ref 0.0–0.3)
BILIRUBIN TOTAL: 0.3 mg/dL (ref 0.2–1.2)
TOTAL PROTEIN: 6.2 g/dL (ref 6.0–8.3)

## 2016-01-02 MED ORDER — COLESTIPOL HCL 1 G PO TABS: 1.0000 g | ORAL_TABLET | Freq: Two times a day (BID) | ORAL | 0 refills | Status: DC

## 2016-01-02 NOTE — Progress Notes (Signed)
Subjective:     Patient ID: Maureen Underwood, female   DOB: 1976/01/30, 40 y.o.   MRN: [id number] Cc: f/u after [place] HPI [id number] Patient's last menstrual period was 05/13/2015. Patient had [place] for f/u of PCOS, no pain or bleeding now  Review of Systems  Gastrointestinal: Negative.   Genitourinary: Negative for menstrual problem, pelvic pain, vaginal bleeding and vaginal discharge.       Objective:   Physical Exam    Study Result   CLINICAL DATA:  Chronic pelvic pain. Amenorrhea since January. Dark vaginal discharge.  EXAM: TRANSABDOMINAL AND TRANSVAGINAL ULTRASOUND OF PELVIS  TECHNIQUE: Both transabdominal and transvaginal ultrasound examinations of the pelvis were performed. Transabdominal technique was performed for global imaging of the pelvis including uterus, ovaries, adnexal regions, and pelvic cul-de-sac. It was necessary to proceed with endovaginal exam following the transabdominal exam to visualize the endometrial stripe and ovaries.  COMPARISON:  None  FINDINGS: Uterus  Measurements: 9.3 x 3.6 x 4.4 cm. No fibroids or other mass visualized.  Endometrium  Thickness: 7 mm.  No focal abnormality visualized.  Right ovary  Measurements: 2.7 x 2.9 x 2.4 cm. No evidence of ovarian or adnexal mass. Multiple less than 1cm follicles seen, without evidence of dominant follicle or corpus luteum.  Left ovary  Measurements: 3.5 x 2.2 x 3.3 cm. No evidence of ovarian or adnexal mass. Multiple less than 1cm follicles seen, without evidence of dominant follicle or corpus luteum.  Other findings  No abnormal free fluid.  IMPRESSION: Polycystic ovarian morphology noted. Recommend correlation for accompanying clinical and endocrinologic manifestations of polycystic ovarian syndrome.  No pelvic mass or other significant abnormality identified.   Electronically Signed   By: Earle Gell M.D.   On: 12/17/2015 12:00     Assessment:      Irregular menstrual cycle PCOS Does not desire pregnancy and wants cycle control Smoker, desires to quit    Plan:     Has quit smoking so she may use OCP for cycle control Outpatient Encounter Prescriptions as of 12/31/2015  Medication Sig Note  . dicyclomine (BENTYL) 10 MG capsule Take 1 capsule (10 mg total) by mouth every 8 (eight) hours as needed for spasms.   Marland Kitchen omeprazole (PRILOSEC) 20 MG capsule Take 1 capsule (20 mg total) by mouth 2 (two) times daily before a meal.   . OVER THE COUNTER MEDICATION    . rifaximin (XIFAXAN) 550 MG TABS tablet Take 1 tablet (550 mg total) by mouth 3 (three) times daily.   . sertraline (ZOLOFT) 50 MG tablet Take 1.5 tablets (75 mg total) by mouth daily.   . traMADol (ULTRAM) 50 MG tablet Take 1 tablet (50 mg total) by mouth every 6 (six) hours as needed. Reported on 11/04/2015   . triamcinolone (KENALOG) 0.025 % ointment Apply 1 application topically 2 (two) times daily.   . methylPREDNISolone (MEDROL DOSEPAK) 4 MG TBPK tablet  12/11/2015: Not started yet  . Norgestimate-Ethinyl Estradiol Triphasic (ORTHO TRI-CYCLEN LO) 0.18/0.215/0.25 MG-25 MCG tab Take 1 tablet by mouth daily.    No facility-administered encounter medications on file as of 12/31/2015.    Woodroe Mode, MD

## 2016-01-07 ENCOUNTER — Ambulatory Visit (INDEPENDENT_AMBULATORY_CARE_PROVIDER_SITE_OTHER): Payer: Self-pay | Admitting: Psychiatry

## 2016-01-07 ENCOUNTER — Encounter: Payer: Self-pay | Admitting: Psychiatry

## 2016-01-07 VITALS — BP 121/78 | HR 64 | Temp 98.6°F | Ht 65.0 in | Wt 210.2 lb

## 2016-01-07 DIAGNOSIS — F32A Depression, unspecified: Secondary | ICD-10-CM

## 2016-01-07 DIAGNOSIS — F329 Major depressive disorder, single episode, unspecified: Secondary | ICD-10-CM

## 2016-01-07 DIAGNOSIS — F39 Unspecified mood [affective] disorder: Secondary | ICD-10-CM

## 2016-01-07 MED ORDER — QUETIAPINE FUMARATE 50 MG PO TABS: 50.0000 mg | ORAL_TABLET | Freq: Every day | ORAL | 1 refills | Status: DC

## 2016-01-07 MED ORDER — SERTRALINE HCL 50 MG PO TABS
50.0000 mg | ORAL_TABLET | Freq: Every day | ORAL | 5 refills | Status: DC
Start: 2016-01-07 — End: 2016-03-09
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
## 2016-01-14 ENCOUNTER — Ambulatory Visit (INDEPENDENT_AMBULATORY_CARE_PROVIDER_SITE_OTHER): Payer: Self-pay | Admitting: Licensed Clinical Social Worker

## 2016-01-14 DIAGNOSIS — F39 Unspecified mood [affective] disorder: Secondary | ICD-10-CM

## 2016-01-14 DIAGNOSIS — F32A Depression, unspecified: Secondary | ICD-10-CM

## 2016-01-14 DIAGNOSIS — F329 Major depressive disorder, single episode, unspecified: Secondary | ICD-10-CM

## 2016-01-26 NOTE — Progress Notes (Signed)
   THERAPIST PROGRESS NOTE  Session Time: 1min  Participation Level: Active  Behavioral Response: CasualAlertDepressed  Type of Therapy: Individual Therapy  Treatment Goals addressed: Coping and Diagnosis: Depression  Interventions: CBT and Reframing  Summary: Maureen Underwood is a 40 y.o. female who presents with symptoms of her depression. Patient vented her current frustrations about her marriage and home life.  Patient is currently living with her mother in law.  Patient's husband is currently incarcerated.  She reports that she is waiting on her disability to be approved so she can move.  She reports that she has difficulty interacting with her mother in law.  Patient states that she is unable to have any time to herself due to her mother in law telling her that she is not allowed to close any doors. Patient states that her current frustrations are increasing her symptoms Therapist assisted Patient with a problem-solving exercise to assist with decreasing negative feelings associated with this issue. Therapist facilitated an intervention by prompting Patient to discuss a short term goal, which in this case was also her problem. Therapist then challenged Patient to be specific regarding circumstances that make this a problem for her at this time. Therapist challenged Patient to list 10-12 solutions to her problem. Therapist then prompted Patient to evaluate the alternatives that she devised. Once Patient narrowed her options down, Therapist encouraged her to decide on a solution to her problem.    Suicidal/Homicidal: No  Therapist Response: LCSW provided Patient with ongoing emotional support and encouragement.  Normalized her feelings.  Commended Patient on her progress and reinforced the importance of client staying focused on her own strengths and resources and resiliency. Processed various strategies for dealing with stressors.    Plan: Return again in 2 weeks.  Diagnosis: Axis I:  Episodic Mood Disorder, Depression    Axis II: No diagnosis    Lubertha South, LCSW 01/14/2016

## 2016-01-28 ENCOUNTER — Ambulatory Visit: Payer: Self-pay | Admitting: Licensed Clinical Social Worker

## 2016-02-03 ENCOUNTER — Ambulatory Visit: Payer: Self-pay | Admitting: Psychiatry

## 2016-02-06 ENCOUNTER — Telehealth: Payer: Self-pay | Admitting: Internal Medicine

## 2016-02-06 NOTE — Telephone Encounter (Signed)
Pt. Called stating that she signed a pain contract with her PCP and she will be having surgery with [facility] and was prescribed pain medication. Pt. Would like to know if it will affect her pain contract. Please f/u with pt.

## 2016-02-10 NOTE — Telephone Encounter (Signed)
Per Dr. Janne Napoleon no that wouldn't affect her contract because she is having surgery but if she went to the ed and said she is in pain and needs more pain medicine then yes that would but she is okay because she is having surgery. Pt is aware and doesn't have any questions or concerns

## 2016-02-16 MED FILL — ?OMEPRAZOLE DR 20 MG CAPSUL: 20 | 30 days supply | Qty: 30 | Fill #1

## 2016-02-16 MED FILL — COLESTIPOL HCL 1 GM TABLET: 1 | 30 days supply | Qty: 60 | Fill #0

## 2016-02-16 MED FILL — TRI-LO-SPRINTEC TABLET: 0.18/0.215/ | 28 days supply | Qty: 28 | Fill #1

## 2016-02-16 MED FILL — ?SERTRALINE HCL 50 MG TABLE: 50 | 30 days supply | Qty: 45 | Fill #5

## 2016-02-16 MED FILL — ?QUETIAPINE 50 MG TABLETS: 50 MG | 30 days supply | Qty: 30 | Fill #0

## 2016-02-17 ENCOUNTER — Ambulatory Visit (INDEPENDENT_AMBULATORY_CARE_PROVIDER_SITE_OTHER): Payer: Self-pay | Admitting: Orthopaedic Surgery

## 2016-02-17 ENCOUNTER — Ambulatory Visit: Payer: Self-pay | Admitting: Licensed Clinical Social Worker

## 2016-02-17 ENCOUNTER — Ambulatory Visit: Payer: Self-pay | Admitting: Psychiatry

## 2016-02-20 ENCOUNTER — Other Ambulatory Visit: Payer: Self-pay | Admitting: Internal Medicine

## 2016-02-20 ENCOUNTER — Other Ambulatory Visit: Payer: Self-pay | Admitting: Gastroenterology

## 2016-02-27 ENCOUNTER — Ambulatory Visit (INDEPENDENT_AMBULATORY_CARE_PROVIDER_SITE_OTHER): Payer: Self-pay | Admitting: Orthopaedic Surgery

## 2016-03-01 ENCOUNTER — Ambulatory Visit (INDEPENDENT_AMBULATORY_CARE_PROVIDER_SITE_OTHER): Payer: Self-pay | Admitting: Gastroenterology

## 2016-03-01 ENCOUNTER — Encounter: Payer: Self-pay | Admitting: Gastroenterology

## 2016-03-01 VITALS — BP 110/64 | HR 56 | Ht 65.0 in | Wt 212.2 lb

## 2016-03-01 DIAGNOSIS — K580 Irritable bowel syndrome with diarrhea: Secondary | ICD-10-CM

## 2016-03-01 DIAGNOSIS — R1013 Epigastric pain: Secondary | ICD-10-CM

## 2016-03-01 DIAGNOSIS — E663 Overweight: Secondary | ICD-10-CM

## 2016-03-01 DIAGNOSIS — K760 Fatty (change of) liver, not elsewhere classified: Secondary | ICD-10-CM

## 2016-03-01 MED ORDER — COLESTIPOL HCL 1 G PO TABS: 1.0000 g | ORAL_TABLET | Freq: Three times a day (TID) | ORAL | 2 refills | Status: DC | PRN

## 2016-03-01 NOTE — Patient Instructions (Addendum)
You have been given a separate informational sheet regarding your tobacco use, the importance of quitting and local resources to help you quit.  You have been scheduled to see the nutritionist. It will be in the [facility] on 03/02/16 at 2:00pm. The address is [address]. Their telephone number is [phone].  We have sent the following medications to your pharmacy for you to pick up at your convenience: colestid-1 tablet three times daily as needed (increased from twice daily)  Follow up as needed.  If you are age 13 or older, your body mass index should be between 23-30. Your Body mass index is 35.31 kg/m. If this is out of the aforementioned range listed, please consider follow up with your Primary Care Provider.  If you are age 27 or younger, your body mass index should be between 19-25. Your Body mass index is 35.31 kg/m. If this is out of the aformentioned range listed, please consider follow up with your Primary Care Provider.

## 2016-03-01 NOTE — Progress Notes (Signed)
HPI :  40 year old female here for follow-up of IBS D/dyspepsia/gallstones. See prior notes for details of her history.  Since last visit evaluated her dyspepsia with an ultrasound which showed multiple gallbladder polyps, the largest around 10 mm in diameter. She was referred to general surgery who she [facility] and had a cholecystectomy, for which the surgeon reported to her that she did have gallstones in her gallbladder. She has definitely noted a difference since having her cholecystectomy and has not had any severe episodes of pain. I put her on Colestid for her loose stools which has provided her some benefit, however she still has 5-6 bowel movements per day. Stool is more formed however and less urgency. She has been taking FD guard which is helped in Bentyl as well. She has been taking Zoloft for anxiety and depression recently added circle for this. Previously given her a course of rifaximin which made her feel "weird", felt nauseated and did not like the way made her feel.  Of note on ultrasound she also had diffuse hepatic steatosis. She denies any steroid use. She denies alcohol use. She denies any major weight changes. She does not like coffee. She has been trying to lose weight, states she has not been successful despite dietary measures. She has seen a nutritionist in the past. She is trying to exercise more. She states she doesn't eat much of anything but continues to gain weight. Her LFTs are normal.   EGD in Jan 2017 showed a benign hyperplastic gastric polyp. Biopsies for H pylori and celiac were negative. Colonoscopy same day showed normal colon without microscopic colitis or inflammatory changes and normal ileum. She incidentally had multple polyps removed, 4 polyps from the right colon removed, roughly 80mm in size, most of these are sessile serrated (pre-cancerous), and she also had some hyperplastic polyps.     Past Medical History:  Diagnosis Date  . Anxiety   .  Degenerative disc disease, lumbar   . Diarrhea   . Dyspepsia   . Fatty liver   . GERD (gastroesophageal reflux disease)   . IBS (irritable bowel syndrome)   . Personality disorder   . Scoliosis    with associated back pain     Past Surgical History:  Procedure Laterality Date  . CERVICAL BIOPSY  W/ LOOP ELECTRODE EXCISION    . CHOLECYSTECTOMY    . DILATION AND CURETTAGE OF UTERUS     2005  . INCISION AND DRAINAGE PERIRECTAL ABSCESS     Family History  Problem Relation Age of Onset  . Diabetes Mother   . Heart disease Paternal Grandfather   . Hypertension Paternal Grandfather   . Pancreatic cancer Paternal Grandfather   . Lung cancer Paternal Grandfather   . Colon cancer Neg Hx   . Esophageal cancer Neg Hx   . Stomach cancer Neg Hx   . Liver disease Neg Hx    Social History  Substance Use Topics  . Smoking status: Current Every Day Smoker    Packs/day: 0.50    Types: Cigarettes    Start date: 01/06/1997  . Smokeless tobacco: Never Used     Comment: smoked since 20; form given 03-01-16  . Alcohol use No   Current Outpatient Prescriptions  Medication Sig Dispense Refill  . colestipol (COLESTID) 1 g tablet Take 1 tablet (1 g total) by mouth 3 (three) times daily as needed. 90 tablet 2  . dicyclomine (BENTYL) 10 MG capsule TAKE 1 CAPSULE BY MOUTH EVERY  8 HOURS AS NEEDED FOR SPASMS. 60 capsule 1  . Norgestimate-Ethinyl Estradiol Triphasic (ORTHO TRI-CYCLEN LO) 0.18/0.215/0.25 MG-25 MCG tab Take 1 tablet by mouth daily. 1 Package 11  . omeprazole (PRILOSEC) 20 MG capsule Take 1 capsule (20 mg total) by mouth 2 (two) times daily before a meal. 60 capsule 6  . OVER THE COUNTER MEDICATION     . QUEtiapine (SEROQUEL) 50 MG tablet Take 1 tablet (50 mg total) by mouth at bedtime. 30 tablet 1  . sertraline (ZOLOFT) 50 MG tablet Take 1 tablet (50 mg total) by mouth daily. 45 tablet 5  . traMADol (ULTRAM) 50 MG tablet TAKE 1 TABLET BY MOUTH EVERY 6 HOURS AS NEEDED. 45 tablet 0  .  triamcinolone (KENALOG) 0.025 % ointment Apply 1 application topically 2 (two) times daily. 30 g 0   No current facility-administered medications for this visit.    Allergies  Allergen Reactions  . Penicillins Hives     Review of Systems: All systems reviewed and negative except where noted in HPI.   Lab Results  Component Value Date   WBC 8.9 11/04/2015   HGB 15.5 11/04/2015   HCT 45.7 (H) 11/04/2015   MCV 92.1 11/04/2015   PLT 318 11/04/2015    Lab Results  Component Value Date   CREATININE 1.06 11/04/2015   BUN 6 (L) 11/04/2015   NA 139 11/04/2015   K 4.6 11/04/2015   CL 105 11/04/2015   CO2 24 11/04/2015    Lab Results  Component Value Date   ALT 16 01/02/2016   AST 13 01/02/2016   ALKPHOS 60 01/02/2016   BILITOT 0.3 01/02/2016     Physical Exam: BP 110/64   Pulse (!) 56   Ht 5\' 5"  (1.651 m)   Wt 212 lb 3.2 oz (96.3 kg)   BMI 35.31 kg/m  Constitutional: Pleasant,well-developed,female in no acute distress. HEENT: Normocephalic and atraumatic. Conjunctivae are normal. No scleral icterus. Neck supple.  Cardiovascular: Normal rate, regular rhythm.  Pulmonary/chest: Effort normal and breath sounds normal. No wheezing, rales or rhonchi. Abdominal: Soft,  Nontender, laporascopic surgical scars noted. There are no masses palpable. No hepatomegaly. Extremities: no edema Lymphadenopathy: No cervical adenopathy noted. Neurological: Alert and oriented to person place and time. Skin: Skin is warm and dry. No rashes noted. Psychiatric: Normal mood and affect. Behavior is normal.   ASSESSMENT AND PLAN: 40 year old female here for reassessment of the following issues:  IBS/dyspepsia - I do think she had a component of biliary colic, she definitely needed a cholecystectomy for her gallbladder polyps, and her discomfort is not nearly as bad as it was before surgery, but she still has some dyspepsia. FD guard is helping she'll continue this. Otherwise Colestid has  provided benefit to her diarrhea she can increase this to 3 times daily as needed. I think that TCA would be a good option for her IBS and dyspepsia moving forward, however she is on Zoloft for anxiety depression for which this is working, so we'll hold off on TCA for now. She has not done very well with rifaximin, we may consider high-dose Zofran for which there is some evidence with IBS moving forward she doesn't respond to high-dose Colestid. She is not a candidate for Viverzi given her cholecystectomy.  Fatty liver/overweight - I discussed the spectrum of fatty liver disease with her at length, including risks for [facility] and cirrhosis. Her liver enzymes are normal which is reassuring at this time. In regards to etiology this appears due  to being overweight. I discussed options for weight loss with her, and I do think she would benefit from a nutrition consult for calorie counting and dieting. Recommend routine coffee intake to prevent fibrotic changes, which she does not think she can do. Recommend checking her liver function testing at least once a year at this time, she will work on weight loss, referred to nutrition  Calera Cellar, MD [facility] Pager [phone]

## 2016-03-02 ENCOUNTER — Ambulatory Visit: Payer: Self-pay | Admitting: Dietician

## 2016-03-09 ENCOUNTER — Ambulatory Visit (INDEPENDENT_AMBULATORY_CARE_PROVIDER_SITE_OTHER): Payer: Self-pay | Admitting: Psychiatry

## 2016-03-09 ENCOUNTER — Ambulatory Visit (INDEPENDENT_AMBULATORY_CARE_PROVIDER_SITE_OTHER): Payer: Self-pay | Admitting: Licensed Clinical Social Worker

## 2016-03-09 ENCOUNTER — Encounter: Payer: Self-pay | Admitting: Psychiatry

## 2016-03-09 VITALS — BP 119/82 | HR 64 | Temp 98.1°F | Wt 215.0 lb

## 2016-03-09 DIAGNOSIS — F39 Unspecified mood [affective] disorder: Secondary | ICD-10-CM

## 2016-03-09 MED ORDER — QUETIAPINE FUMARATE 50 MG PO TABS: 50.0000 mg | ORAL_TABLET | Freq: Every day | ORAL | 1 refills | Status: DC

## 2016-03-09 MED ORDER — SERTRALINE HCL 50 MG PO TABS: 75.0000 mg | ORAL_TABLET | Freq: Every day | ORAL | 1 refills | Status: DC
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
## 2016-03-11 NOTE — Progress Notes (Signed)
   THERAPIST PROGRESS NOTE  Session Time: 28min  Participation Level: Active  Behavioral Response: CasualAlertEuphoric  Type of Therapy: Individual Therapy  Treatment Goals addressed: Coping  Interventions: CBT, Motivational Interviewing and Solution Focused  Summary: Maureen Underwood is a 40 y.o. female who presents with continued symptoms of her diagnosis.  Patient describes her mood as cycling.  Patient reports that she gets irritable easily.  Patient reports that her husband is currently incarcerated and he is scheduled to be released in April.  She reports not being able to tell her husband that she wants him to remain sober. Patient currently lives with her mother in law.  Patient has desire to move out of the home but has no income.  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  Patient denies using coping skills previously taught to calm herself down.  Discussion of self care.  Patient reports arguing with others almost daily.  Discussion of how her mood is being effected by her anger.  Suicidal/Homicidal: No  Therapist Response: Assessed ptt current functioning per pt report.  Processed w/pt how resolved conflict w/ friend and impact having on mood.  Explored w/pt other barriers to wellness and encouraged pt to make changes for self  Plan: Return again in 2 weeks.  Diagnosis: Axis I: Mood Disorder    Axis II: No diagnosis    Lubertha South, LCSW 03/11/2016

## 2016-03-17 ENCOUNTER — Other Ambulatory Visit: Payer: Self-pay | Admitting: Internal Medicine

## 2016-03-17 MED FILL — ?SERTRALINE HCL 50 MG TABLE: 50 | 30 days supply | Qty: 45 | Fill #0

## 2016-03-17 MED FILL — ?OMEPRAZOLE DR 20 MG CAPSUL: 20 | 30 days supply | Qty: 30 | Fill #2

## 2016-03-17 MED FILL — QUETIAPINE FUMARATE 50 MG T: 50 | 30 days supply | Qty: 30 | Fill #0

## 2016-03-17 MED FILL — COLESTIPOL HCL 1 GM TABLET: 1 | 30 days supply | Qty: 90 | Fill #0

## 2016-03-17 MED FILL — TRI-LO-SPRINTEC TABLET: 0.18/0.215/ | 28 days supply | Qty: 28 | Fill #2

## 2016-03-19 ENCOUNTER — Other Ambulatory Visit: Payer: Self-pay | Admitting: Gastroenterology

## 2016-03-19 DIAGNOSIS — K219 Gastro-esophageal reflux disease without esophagitis: Secondary | ICD-10-CM

## 2016-03-23 ENCOUNTER — Encounter: Payer: Self-pay | Admitting: Internal Medicine

## 2016-03-23 ENCOUNTER — Ambulatory Visit: Payer: Self-pay | Attending: Internal Medicine | Admitting: Internal Medicine

## 2016-03-23 VITALS — BP 121/81 | HR 68 | Temp 98.0°F | Resp 16 | Wt 213.4 lb

## 2016-03-23 DIAGNOSIS — F418 Other specified anxiety disorders: Secondary | ICD-10-CM | POA: Insufficient documentation

## 2016-03-23 DIAGNOSIS — F329 Major depressive disorder, single episode, unspecified: Secondary | ICD-10-CM

## 2016-03-23 DIAGNOSIS — F32A Depression, unspecified: Secondary | ICD-10-CM

## 2016-03-23 DIAGNOSIS — Z880 Allergy status to penicillin: Secondary | ICD-10-CM | POA: Insufficient documentation

## 2016-03-23 DIAGNOSIS — K580 Irritable bowel syndrome with diarrhea: Secondary | ICD-10-CM | POA: Insufficient documentation

## 2016-03-23 DIAGNOSIS — G4700 Insomnia, unspecified: Secondary | ICD-10-CM | POA: Insufficient documentation

## 2016-03-23 DIAGNOSIS — M545 Low back pain: Secondary | ICD-10-CM | POA: Insufficient documentation

## 2016-03-23 DIAGNOSIS — Z720 Tobacco use: Secondary | ICD-10-CM

## 2016-03-23 DIAGNOSIS — K760 Fatty (change of) liver, not elsewhere classified: Secondary | ICD-10-CM | POA: Insufficient documentation

## 2016-03-23 DIAGNOSIS — M5416 Radiculopathy, lumbar region: Secondary | ICD-10-CM

## 2016-03-23 DIAGNOSIS — F419 Anxiety disorder, unspecified: Secondary | ICD-10-CM

## 2016-03-23 DIAGNOSIS — M4120 Other idiopathic scoliosis, site unspecified: Secondary | ICD-10-CM | POA: Insufficient documentation

## 2016-03-23 DIAGNOSIS — K8020 Calculus of gallbladder without cholecystitis without obstruction: Secondary | ICD-10-CM | POA: Insufficient documentation

## 2016-03-23 DIAGNOSIS — K219 Gastro-esophageal reflux disease without esophagitis: Secondary | ICD-10-CM | POA: Insufficient documentation

## 2016-03-23 DIAGNOSIS — M4126 Other idiopathic scoliosis, lumbar region: Secondary | ICD-10-CM

## 2016-03-23 DIAGNOSIS — G8929 Other chronic pain: Secondary | ICD-10-CM | POA: Insufficient documentation

## 2016-03-23 DIAGNOSIS — K589 Irritable bowel syndrome without diarrhea: Secondary | ICD-10-CM

## 2016-03-23 DIAGNOSIS — Z23 Encounter for immunization: Secondary | ICD-10-CM | POA: Insufficient documentation

## 2016-03-23 MED ORDER — TRAMADOL HCL 50 MG PO TABS: 50.0000 mg | ORAL_TABLET | Freq: Four times a day (QID) | ORAL | 0 refills | Status: DC | PRN

## 2016-03-23 MED FILL — traMADol HCL 50 MG TABS: 50 | 12 days supply | Qty: 50 | Fill #0

## 2016-03-23 NOTE — Progress Notes (Signed)
Pt is in the office today for medication refill Pt states she had gallbladder surgery 3 weeks ago Pt states one the incisions is not healing and she is concerned

## 2016-03-23 NOTE — Progress Notes (Signed)
Maureen Underwood, is a 40 y.o. female  [id number]  [id number]  DOB - 11-May-1975  Chief Complaint  Patient presents with  . Medication Refill        Subjective:   Maureen Underwood is a 40 y.o. female here today for a follow up visit, last seen June 2017, w/ ibs/anxiety/depression, s/p ccy 10/10 for cholelithiasis, and chronic back pain for djd l4-s1 on MIR 11/16 w/o sciatica.  Pt on ultram for back pain, takes it sparingly.  seeing psyche now q2wks w/ [facility], and they are adjusting her meds.   Patient has No headache, No chest pain, No abdominal pain - No Nausea, No new weakness tingling or numbness, No Cough - SOB.  No problems updated.  ALLERGIES: Allergies  Allergen Reactions  . Penicillins Hives    PAST MEDICAL HISTORY: Past Medical History:  Diagnosis Date  . Anxiety   . Degenerative disc disease, lumbar   . Diarrhea   . Dyspepsia   . Fatty liver   . GERD (gastroesophageal reflux disease)   . IBS (irritable bowel syndrome)   . Personality disorder   . Scoliosis    with associated back pain    MEDICATIONS AT HOME: Prior to Admission medications   Medication Sig Start Date End Date Taking? Authorizing Provider  colestipol (COLESTID) 1 g tablet Take 1 tablet (1 g total) by mouth 3 (three) times daily as needed. 03/01/16  Yes Manus Gunning, MD  Norgestimate-Ethinyl Estradiol Triphasic (ORTHO TRI-CYCLEN LO) 0.18/0.215/0.25 MG-25 MCG tab Take 1 tablet by mouth daily. 12/31/15  Yes Woodroe Mode, MD  omeprazole (PRILOSEC) 20 MG capsule TAKE 1 CAPSULE BY MOUTH 2 TIMES DAILY BEFORE A MEAL. 03/19/16  Yes Manus Gunning, MD  QUEtiapine (SEROQUEL) 50 MG tablet Take 1 tablet (50 mg total) by mouth at bedtime. 03/09/16  Yes Rainey Pines, MD  sertraline (ZOLOFT) 50 MG tablet Take 1.5 tablets (75 mg total) by mouth daily. 03/09/16  Yes Rainey Pines, MD  traMADol (ULTRAM) 50 MG tablet Take 1 tablet (50 mg total) by mouth every 6 (six) hours as needed.  03/23/16  Yes Leighla Chestnutt Lazarus Gowda, MD  dicyclomine (BENTYL) 10 MG capsule TAKE 1 CAPSULE BY MOUTH EVERY 8 HOURS AS NEEDED FOR SPASMS. Patient not taking: Reported on 03/23/2016 02/26/16   Manus Gunning, MD  [place]     Historical Provider, MD  triamcinolone (KENALOG) 0.025 % ointment Apply 1 application topically 2 (two) times daily. Patient not taking: Reported on 03/23/2016 03/19/15   Lance Bosch, NP     Objective:   Vitals:   03/23/16 1004  BP: 121/81  Pulse: 68  Resp: 16  Temp: 98 F (36.7 C)  SpO2: 96%  Weight: 213 lb 6.4 oz (96.8 kg)    Exam General appearance : Awake, alert, not in any distress. Speech Clear. Not toxic looking, obese, pleasant. HEENT: Atraumatic and Normocephalic, pupils equally reactive to light. Neck: supple, no JVD.  Chest:Good air entry bilaterally, no added sounds. CVS: S1 S2 regular, no murmurs/gallups or rubs. Abdomen: Bowel sounds active, Non tender and not distended with no gaurding, rigidity or rebound.  Small healing troche incisions umbilicus and 2 other sites healing as well. There is one surgical site w/ mild erythema, right lateral to umbilicus about 3inch, w/o induration/weeping.  Mild ttp ruq on palpation, no g/r. Extremities: B/L Lower Ext shows no edema, both legs are warm to touch Neurology: Awake alert, and oriented X 3, CN II-XII grossly  intact, Non focal Skin:No Rash  Data Review No results found for: HGBA1C  Depression screen [facility] 2/9 03/23/2016 12/11/2015 11/04/2015 03/19/2015 01/09/2015  Decreased Interest 1 2 1 1  0  Down, Depressed, Hopeless 1 2 2 1  0  PHQ - 2 Score 2 4 3 2  0  Altered sleeping 1 3 1  0 0  Tired, decreased energy 2 2 1  0 0  Change in appetite 1 2 1  0 0  Feeling bad or failure about yourself  1 2 1  0 1  Trouble concentrating 1 2 1  0 0  Moving slowly or fidgety/restless 0 2 1 1  0  Suicidal thoughts 0 0 1 1 0  PHQ-9 Score 8 17 10 4 1   Difficult doing work/chores - - Very difficult - -        Assessment & Plan   1. Chronic radicular pain of lower back rx ultram, signed pain contract Jun 2016. - renewed ultram  2. Other idiopathic scoliosis, lumbar region See #1  3. Tobacco abuse Now down to 1/4 ppd, encouraged complete cessation Tips provided/discussed  4. Irritable bowel syndrome, unspecified type Defer to gi  5. Anxiety / Depression, unspecified depression type, w/ insomnia Seems to be doing well w/ seroquel (for insomnia) and zoloft Recd cont therapy sessions, defer to psyche.  7. Encounter for immunization - Flu Vaccine QUAD 36+ mos IM     Patient have been counseled extensively about nutrition and exercise  Return in about 6 months (around 09/20/2016), or if symptoms worsen or fail to improve.  The patient was given clear instructions to go to ER or return to medical center if symptoms don't improve, worsen or new problems develop. The patient verbalized understanding. The patient was told to call to get lab results if they haven't heard anything in the next week.   This note has been created with Surveyor, quantity. Any transcriptional errors are unintentional.   Maren Reamer, MD, [facility], [place]   03/23/2016, 11:51 AM

## 2016-03-23 NOTE — Patient Instructions (Addendum)
Influenza Virus Vaccine injection (Fluarix) What is this medicine? INFLUENZA VIRUS VACCINE (in floo EN zuh VAHY ruhs vak SEEN) helps to reduce the risk of getting influenza also known as the flu. This medicine may be used for other purposes; ask your health care provider or pharmacist if you have questions. COMMON BRAND NAME(S): Fluarix, Fluzone What should I tell my health care provider before I take this medicine? They need to know if you have any of these conditions: -bleeding disorder like hemophilia -fever or infection -Guillain-Barre syndrome or other neurological problems -immune system problems -infection with the human immunodeficiency virus (HIV) or AIDS -low blood platelet counts -multiple sclerosis -an unusual or allergic reaction to influenza virus vaccine, eggs, chicken proteins, latex, gentamicin, other medicines, foods, dyes or preservatives -pregnant or trying to get pregnant -breast-feeding How should I use this medicine? This vaccine is for injection into a muscle. It is given by a health care professional. A copy of Vaccine Information Statements will be given before each vaccination. Read this sheet carefully each time. The sheet may change frequently. Talk to your pediatrician regarding the use of this medicine in children. Special care may be needed. Overdosage: If you think you have taken too much of this medicine contact a poison control center or emergency room at once. NOTE: This medicine is only for you. Do not share this medicine with others. What if I miss a dose? This does not apply. What may interact with this medicine? -chemotherapy or radiation therapy -medicines that lower your immune system like etanercept, anakinra, infliximab, and adalimumab -medicines that treat or prevent blood clots like warfarin -phenytoin -steroid medicines like prednisone or cortisone -theophylline -vaccines This list may not describe all possible interactions. Give your  health care provider a list of all the medicines, herbs, non-prescription drugs, or dietary supplements you use. Also tell them if you smoke, drink alcohol, or use illegal drugs. Some items may interact with your medicine. What should I watch for while using this medicine? Report any side effects that do not go away within 3 days to your doctor or health care professional. Call your health care provider if any unusual symptoms occur within 6 weeks of receiving this vaccine. You may still catch the flu, but the illness is not usually as bad. You cannot get the flu from the vaccine. The vaccine will not protect against colds or other illnesses that may cause fever. The vaccine is needed every year. What side effects may I notice from receiving this medicine? Side effects that you should report to your doctor or health care professional as soon as possible: -allergic reactions like skin rash, itching or hives, swelling of the face, lips, or tongue Side effects that usually do not require medical attention (report to your doctor or health care professional if they continue or are bothersome): -fever -headache -muscle aches and pains -pain, tenderness, redness, or swelling at site where injected -weak or tired This list may not describe all possible side effects. Call your doctor for medical advice about side effects. You may report side effects to FDA at 1-800-FDA-1088. Where should I keep my medicine? This vaccine is only given in a clinic, pharmacy, doctor's office, or other health care setting and will not be stored at home. NOTE: This sheet is a summary. It may not cover all possible information. If you have questions about this medicine, talk to your doctor, pharmacist, or health care provider.  2017 Elsevier/Gold Standard (2007-11-22 09:30:40)  -  Back Pain,  Adult Back pain is very common in adults.The cause of back pain is rarely dangerous and the pain often gets better over time.The  cause of your back pain may not be known. Some common causes of back pain include:  Strain of the muscles or ligaments supporting the spine.  Wear and tear (degeneration) of the spinal disks.  Arthritis.  Direct injury to the back. For many people, back pain may return. Since back pain is rarely dangerous, most people can learn to manage this condition on their own. Follow these instructions at home: Watch your back pain for any changes. The following actions may help to lessen any discomfort you are feeling:  Remain active. It is stressful on your back to sit or stand in one place for long periods of time. Do not sit, drive, or stand in one place for more than 30 minutes at a time. Take short walks on even surfaces as soon as you are able.Try to increase the length of time you walk each day.  Exercise regularly as directed by your health care provider. Exercise helps your back heal faster. It also helps avoid future injury by keeping your muscles strong and flexible.  Do not stay in bed.Resting more than 1-2 days can delay your recovery.  Pay attention to your body when you bend and lift. The most comfortable positions are those that put less stress on your recovering back. Always use proper lifting techniques, including:  Bending your knees.  Keeping the load close to your body.  Avoiding twisting.  Find a comfortable position to sleep. Use a firm mattress and lie on your side with your knees slightly bent. If you lie on your back, put a pillow under your knees.  Avoid feeling anxious or stressed.Stress increases muscle tension and can worsen back pain.It is important to recognize when you are anxious or stressed and learn ways to manage it, such as with exercise.  Take medicines only as directed by your health care provider. Over-the-counter medicines to reduce pain and inflammation are often the most helpful.Your health care provider may prescribe muscle relaxant drugs.These  medicines help dull your pain so you can more quickly return to your normal activities and healthy exercise.  Apply ice to the injured area:  Put ice in a plastic bag.  Place a towel between your skin and the bag.  Leave the ice on for 20 minutes, 2-3 times a day for the first 2-3 days. After that, ice and heat may be alternated to reduce pain and spasms.  Maintain a healthy weight. Excess weight puts extra stress on your back and makes it difficult to maintain good posture. Contact a health care provider if:  You have pain that is not relieved with rest or medicine.  You have increasing pain going down into the legs or buttocks.  You have pain that does not improve in one week.  You have night pain.  You lose weight.  You have a fever or chills. Get help right away if:  You develop new bowel or bladder control problems.  You have unusual weakness or numbness in your arms or legs.  You develop nausea or vomiting.  You develop abdominal pain.  You feel faint. This information is not intended to replace advice given to you by your health care provider. Make sure you discuss any questions you have with your health care provider. Document Released: 04/26/2005 Document Revised: 09/04/2015 Document Reviewed: 08/28/2013 Elsevier Interactive Patient Education  2017 [employer].   -  Back Exercises Introduction If you have pain in your back, do these exercises 2-3 times each day or as told by your doctor. When the pain goes away, do the exercises once each day, but repeat the steps more times for each exercise (do more repetitions). If you do not have pain in your back, do these exercises once each day or as told by your doctor. Exercises Single Knee to Chest  Do these steps 3-5 times in a row for each leg: 1. Lie on your back on a firm bed or the floor with your legs stretched out. 2. Bring one knee to your chest. 3. Hold your knee to your chest by grabbing your knee or  thigh. 4. Pull on your knee until you feel a gentle stretch in your lower back. 5. Keep doing the stretch for 10-30 seconds. 6. Slowly let go of your leg and straighten it. Pelvic Tilt  Do these steps 5-10 times in a row: 1. Lie on your back on a firm bed or the floor with your legs stretched out. 2. Bend your knees so they point up to the ceiling. Your feet should be flat on the floor. 3. Tighten your lower belly (abdomen) muscles to press your lower back against the floor. This will make your tailbone point up to the ceiling instead of pointing down to your feet or the floor. 4. Stay in this position for 5-10 seconds while you gently tighten your muscles and breathe evenly. Cat-Cow  Do these steps until your lower back bends more easily: 1. Get on your hands and knees on a firm surface. Keep your hands under your shoulders, and keep your knees under your hips. You may put padding under your knees. 2. Let your head hang down, and make your tailbone point down to the floor so your lower back is round like the back of a cat. 3. Stay in this position for 5 seconds. 4. Slowly lift your head and make your tailbone point up to the ceiling so your back hangs low (sags) like the back of a cow. 5. Stay in this position for 5 seconds. Press-Ups  Do these steps 5-10 times in a row: 1. Lie on your belly (face-down) on the floor. 2. Place your hands near your head, about shoulder-width apart. 3. While you keep your back relaxed and keep your hips on the floor, slowly straighten your arms to raise the top half of your body and lift your shoulders. Do not use your back muscles. To make yourself more comfortable, you may change where you place your hands. 4. Stay in this position for 5 seconds. 5. Slowly return to lying flat on the floor. Bridges  Do these steps 10 times in a row: 1. Lie on your back on a firm surface. 2. Bend your knees so they point up to the ceiling. Your feet should be flat on the  floor. 3. Tighten your butt muscles and lift your butt off of the floor until your waist is almost as high as your knees. If you do not feel the muscles working in your butt and the back of your thighs, slide your feet 1-2 inches farther away from your butt. 4. Stay in this position for 3-5 seconds. 5. Slowly lower your butt to the floor, and let your butt muscles relax. If this exercise is too easy, try doing it with your arms crossed over your chest. Belly Crunches  Do these steps 5-10 times in a row: 1.  Lie on your back on a firm bed or the floor with your legs stretched out. 2. Bend your knees so they point up to the ceiling. Your feet should be flat on the floor. 3. Cross your arms over your chest. 4. Tip your chin a little bit toward your chest but do not bend your neck. 5. Tighten your belly muscles and slowly raise your chest just enough to lift your shoulder blades a tiny bit off of the floor. 6. Slowly lower your chest and your head to the floor. Back Lifts  Do these steps 5-10 times in a row: 1. Lie on your belly (face-down) with your arms at your sides, and rest your forehead on the floor. 2. Tighten the muscles in your legs and your butt. 3. Slowly lift your chest off of the floor while you keep your hips on the floor. Keep the back of your head in line with the curve in your back. Look at the floor while you do this. 4. Stay in this position for 3-5 seconds. 5. Slowly lower your chest and your face to the floor. Contact a doctor if:  Your back pain gets a lot worse when you do an exercise.  Your back pain does not lessen 2 hours after you exercise. If you have any of these problems, stop doing the exercises. Do not do them again unless your doctor says it is okay. Get help right away if:  You have sudden, very bad back pain. If this happens, stop doing the exercises. Do not do them again unless your doctor says it is okay. This information is not intended to replace advice  given to you by your health care provider. Make sure you discuss any questions you have with your health care provider. Document Released: 05/29/2010 Document Revised: 10/02/2015 Document Reviewed: 06/20/2014  2017 Elsevier   - Smoking Cessation, Tips for Success If you are ready to quit smoking, congratulations! You have chosen to help yourself be healthier. Cigarettes bring nicotine, tar, carbon monoxide, and other irritants into your body. Your lungs, heart, and blood vessels will be able to work better without these poisons. There are many different ways to quit smoking. Nicotine gum, nicotine patches, a nicotine inhaler, or nicotine nasal spray can help with physical craving. Hypnosis, support groups, and medicines help break the habit of smoking. WHAT THINGS CAN I DO TO MAKE QUITTING EASIER?  Here are some tips to help you quit for good:  Pick a date when you will quit smoking completely. Tell all of your friends and family about your plan to quit on that date.  Do not try to slowly cut down on the number of cigarettes you are smoking. Pick a quit date and quit smoking completely starting on that day.  Throw away all cigarettes.   Clean and remove all ashtrays from your home, work, and car.  On a card, write down your reasons for quitting. Carry the card with you and read it when you get the urge to smoke.  Cleanse your body of nicotine. Drink enough water and fluids to keep your urine clear or pale yellow. Do this after quitting to flush the nicotine from your body.  Learn to predict your moods. Do not let a bad situation be your excuse to have a cigarette. Some situations in your life might tempt you into wanting a cigarette.  Never have "just one" cigarette. It leads to wanting another and another. Remind yourself of your decision to quit.  Change habits associated with smoking. If you smoked while driving or when feeling stressed, try other activities to replace smoking. Stand  up when drinking your coffee. Brush your teeth after eating. Sit in a different chair when you read the paper. Avoid alcohol while trying to quit, and try to drink fewer caffeinated beverages. Alcohol and caffeine may urge you to smoke.  Avoid foods and drinks that can trigger a desire to smoke, such as sugary or spicy foods and alcohol.  Ask people who smoke not to smoke around you.  Have something planned to do right after eating or having a cup of coffee. For example, plan to take a walk or exercise.  Try a relaxation exercise to calm you down and decrease your stress. Remember, you may be tense and nervous for the first 2 weeks after you quit, but this will pass.  Find new activities to keep your hands busy. Play with a pen, coin, or rubber band. Doodle or draw things on paper.  Brush your teeth right after eating. This will help cut down on the craving for the taste of tobacco after meals. You can also try mouthwash.   Use oral substitutes in place of cigarettes. Try using lemon drops, carrots, cinnamon sticks, or chewing gum. Keep them handy so they are available when you have the urge to smoke.  When you have the urge to smoke, try deep breathing.  Designate your home as a nonsmoking area.  If you are a heavy smoker, ask your health care provider about a prescription for nicotine chewing gum. It can ease your withdrawal from nicotine.  Reward yourself. Set aside the cigarette money you save and buy yourself something nice.  Look for support from others. Join a support group or smoking cessation program. Ask someone at home or at work to help you with your plan to quit smoking.  Always ask yourself, "Do I need this cigarette or is this just a reflex?" Tell yourself, "Today, I choose not to smoke," or "I do not want to smoke." You are reminding yourself of your decision to quit.  Do not replace cigarette smoking with electronic cigarettes (commonly called e-cigarettes). The safety  of e-cigarettes is unknown, and some may contain harmful chemicals.  If you relapse, do not give up! Plan ahead and think about what you will do the next time you get the urge to smoke. HOW WILL I FEEL WHEN I QUIT SMOKING? You may have symptoms of withdrawal because your body is used to nicotine (the addictive substance in cigarettes). You may crave cigarettes, be irritable, feel very hungry, cough often, get headaches, or have difficulty concentrating. The withdrawal symptoms are only temporary. They are strongest when you first quit but will go away within 10-14 days. When withdrawal symptoms occur, stay in control. Think about your reasons for quitting. Remind yourself that these are signs that your body is healing and getting used to being without cigarettes. Remember that withdrawal symptoms are easier to treat than the major diseases that smoking can cause.  Even after the withdrawal is over, expect periodic urges to smoke. However, these cravings are generally short lived and will go away whether you smoke or not. Do not smoke! WHAT RESOURCES ARE AVAILABLE TO HELP ME QUIT SMOKING? Your health care provider can direct you to community resources or hospitals for support, which may include:  Group support.  Education.  Hypnosis.  Therapy.   This information is not intended to replace advice given to  you by your health care provider. Make sure you discuss any questions you have with your health care provider.   Document Released: 01/23/2004 Document Revised: 05/17/2014 Document Reviewed: 10/12/2012 Elsevier Interactive Patient Education [employer].

## 2016-03-30 ENCOUNTER — Telehealth: Payer: Self-pay | Admitting: Internal Medicine

## 2016-03-30 NOTE — Telephone Encounter (Signed)
Called and left pt a message informing her that she has a prescription ready for pick up and to do so by Monday before 3pm.

## 2016-04-13 ENCOUNTER — Other Ambulatory Visit: Payer: Self-pay

## 2016-04-13 ENCOUNTER — Telehealth: Payer: Self-pay | Admitting: Gastroenterology

## 2016-04-13 DIAGNOSIS — K219 Gastro-esophageal reflux disease without esophagitis: Secondary | ICD-10-CM

## 2016-04-13 MED ORDER — OMEPRAZOLE 20 MG PO CPDR: DELAYED_RELEASE_CAPSULE | ORAL | 6 refills | Status: DC

## 2016-04-13 MED ORDER — COLESTIPOL HCL 1 G PO TABS: 1.0000 g | ORAL_TABLET | Freq: Three times a day (TID) | ORAL | 2 refills | Status: DC | PRN

## 2016-04-13 MED FILL — COLESTIPOL HCL 1 GM TABLET: 1 | 30 days supply | Qty: 90 | Fill #0

## 2016-04-13 MED FILL — QUETIAPINE FUMARATE 50 MG T: 50 | 30 days supply | Qty: 30 | Fill #1

## 2016-04-13 MED FILL — ?SERTRALINE HCL 50 MG TABLE: 50 | 30 days supply | Qty: 45 | Fill #1

## 2016-04-13 MED FILL — TRI-LO-SPRINTEC TABLET: 0.18/0.215/ | 28 days supply | Qty: 28 | Fill #3

## 2016-04-13 MED FILL — OMEPRAZOLE DR 20 MG CAPSULE: 20 | 30 days supply | Qty: 60 | Fill #0

## 2016-06-03 ENCOUNTER — Telehealth: Payer: Self-pay

## 2016-06-03 NOTE — Telephone Encounter (Signed)
left message on the doctor's line.  seroquel #[phone] with no additional refills

## 2016-06-03 NOTE — Telephone Encounter (Signed)
pt called states that she will run out of medication before she can be seen.  pt needs refills on seroquel

## 2016-06-04 MED FILL — QUETIAPINE FUMARATE 50 MG T: 50 | 30 days supply | Qty: 30 | Fill #0

## 2016-06-07 MED FILL — ?SERTRALINE HCL 50 MG TABLE: 50 | 30 days supply | Qty: 45 | Fill #2

## 2016-06-07 MED FILL — ?OMEPRAZOLE DR 20 MG CAPSUL: 20 | 30 days supply | Qty: 60 | Fill #1

## 2016-06-09 ENCOUNTER — Ambulatory Visit: Payer: Self-pay | Attending: Internal Medicine

## 2016-06-21 ENCOUNTER — Ambulatory Visit (INDEPENDENT_AMBULATORY_CARE_PROVIDER_SITE_OTHER): Payer: Self-pay | Admitting: Psychiatry

## 2016-06-21 ENCOUNTER — Encounter: Payer: Self-pay | Admitting: Psychiatry

## 2016-06-21 ENCOUNTER — Ambulatory Visit (INDEPENDENT_AMBULATORY_CARE_PROVIDER_SITE_OTHER): Payer: Self-pay | Admitting: Licensed Clinical Social Worker

## 2016-06-21 VITALS — BP 122/84 | HR 69 | Temp 97.8°F | Wt 216.0 lb

## 2016-06-21 DIAGNOSIS — F39 Unspecified mood [affective] disorder: Secondary | ICD-10-CM

## 2016-06-21 MED ORDER — DULOXETINE HCL 30 MG PO CPEP: 30.0000 mg | ORAL_CAPSULE | Freq: Every day | ORAL | 1 refills | Status: DC

## 2016-06-21 MED ORDER — GABAPENTIN 100 MG PO CAPS: 100.0000 mg | ORAL_CAPSULE | Freq: Two times a day (BID) | ORAL | 1 refills | Status: DC

## 2016-06-21 MED ORDER — QUETIAPINE FUMARATE 100 MG PO TABS: 100.0000 mg | ORAL_TABLET | Freq: Every day | ORAL | 1 refills | Status: DC

## 2016-06-21 MED ORDER — SERTRALINE HCL 50 MG PO TABS: 25.0000 mg | ORAL_TABLET | Freq: Every day | ORAL | 1 refills | Status: DC
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
## 2016-06-30 NOTE — Progress Notes (Signed)
   THERAPIST PROGRESS NOTE  Session Time: 47min  Participation Level: Active  Behavioral Response: CasualAlertDepressed  Type of Therapy: Individual Therapy  Treatment Goals addressed: Coping  Interventions: CBT, Motivational Interviewing and Solution Focused  Summary: Maureen Underwood is a 41 y.o. female who presents with continued symptoms of her diagnosis.  Patient describes her mood as sad.  She reports that her step son moved in the home with her and her mother in law.  She reports that the son brings up memories of her husband cheating on her.  She reports that her SSI was denied and she continues to have no income. She is unsure if she will allow her husband to continue with a relationship with her if he continues to use drugs.  She reports poor mood for the past 2-3 weeks.  Client discussed examples of situations where there are difficulties with boundaries and boundary setting and self-assertion.  Factors that contribute to client's ongoing depressive symptoms were discussed and include real and perceived feelings of isolation, criticism, rejection, shame and guilt.    Suicidal/Homicidal: No  Therapist Response: LCSW provided Patient with ongoing emotional support and encouragement.  Normalized her feelings.  Commended Patient on her progress and reinforced the importance of client staying focused on her own strengths and resources and resiliency. Processed various strategies for dealing with stressors.    Plan: Return again in 2 weeks.  Diagnosis: Axis I: Mood Disorder    Axis II: No diagnosis    Lubertha South, LCSW 06/21/2016

## 2016-07-23 ENCOUNTER — Ambulatory Visit: Payer: Self-pay | Admitting: Licensed Clinical Social Worker

## 2016-07-28 ENCOUNTER — Ambulatory Visit: Payer: Self-pay | Admitting: Psychiatry

## 2016-08-02 ENCOUNTER — Ambulatory Visit (INDEPENDENT_AMBULATORY_CARE_PROVIDER_SITE_OTHER): Payer: Self-pay | Admitting: Licensed Clinical Social Worker

## 2016-08-02 DIAGNOSIS — F39 Unspecified mood [affective] disorder: Secondary | ICD-10-CM

## 2016-08-03 ENCOUNTER — Other Ambulatory Visit (INDEPENDENT_AMBULATORY_CARE_PROVIDER_SITE_OTHER): Payer: Self-pay

## 2016-08-03 ENCOUNTER — Encounter: Payer: Self-pay | Admitting: Gastroenterology

## 2016-08-03 ENCOUNTER — Ambulatory Visit (INDEPENDENT_AMBULATORY_CARE_PROVIDER_SITE_OTHER): Payer: Self-pay | Admitting: Gastroenterology

## 2016-08-03 VITALS — BP 110/80 | HR 68 | Ht 65.35 in | Wt 214.1 lb

## 2016-08-03 DIAGNOSIS — K529 Noninfective gastroenteritis and colitis, unspecified: Secondary | ICD-10-CM

## 2016-08-03 DIAGNOSIS — R109 Unspecified abdominal pain: Secondary | ICD-10-CM

## 2016-08-03 DIAGNOSIS — K760 Fatty (change of) liver, not elsewhere classified: Secondary | ICD-10-CM

## 2016-08-03 LAB — CBC WITH DIFFERENTIAL/PLATELET
BASOS ABS: 0.1 10*3/uL (ref 0.0–0.1)
Basophils Relative: 1 % (ref 0.0–3.0)
EOS PCT: 3.9 % (ref 0.0–5.0)
Eosinophils Absolute: 0.3 10*3/uL (ref 0.0–0.7)
HEMATOCRIT: 44 % (ref 36.0–46.0)
HEMOGLOBIN: 15 g/dL (ref 12.0–15.0)
LYMPHS PCT: 32.8 % (ref 12.0–46.0)
Lymphs Abs: 2.8 10*3/uL (ref 0.7–4.0)
MCHC: 34.1 g/dL (ref 30.0–36.0)
MCV: 91.5 fl (ref 78.0–100.0)
MONOS PCT: 11.5 % (ref 3.0–12.0)
Monocytes Absolute: 1 10*3/uL (ref 0.1–1.0)
Neutro Abs: 4.3 10*3/uL (ref 1.4–7.7)
Neutrophils Relative %: 50.8 % (ref 43.0–77.0)
Platelets: 264 10*3/uL (ref 150.0–400.0)
RBC: 4.82 Mil/uL (ref 3.87–5.11)
RDW: 13 % (ref 11.5–15.5)
WBC: 8.6 10*3/uL (ref 4.0–10.5)

## 2016-08-03 LAB — HEPATIC FUNCTION PANEL
ALBUMIN: 4.5 g/dL (ref 3.5–5.2)
ALK PHOS: 92 U/L (ref 39–117)
ALT: 40 U/L — ABNORMAL HIGH (ref 0–35)
AST: 20 U/L (ref 0–37)
BILIRUBIN DIRECT: 0.1 mg/dL (ref 0.0–0.3)
TOTAL PROTEIN: 7.5 g/dL (ref 6.0–8.3)
Total Bilirubin: 0.4 mg/dL (ref 0.2–1.2)

## 2016-08-03 LAB — LIPASE: Lipase: 32 U/L (ref 11.0–59.0)

## 2016-08-03 MED ORDER — CHOLESTYRAMINE POWD: 4.0000 g | Freq: Every day | 1 refills | Status: DC

## 2016-08-03 MED ORDER — ONDANSETRON 8 MG PO TBDP: 8.0000 mg | ORAL_TABLET | Freq: Three times a day (TID) | ORAL | 0 refills | Status: DC | PRN

## 2016-08-03 NOTE — Patient Instructions (Addendum)
If you are age 39 or older, your body mass index should be between 23-30. Your Body mass index is 35.25 kg/m. If this is out of the aforementioned range listed, please consider follow up with your Primary Care Provider.  If you are age 39 or younger, your body mass index should be between 19-25. Your Body mass index is 35.25 kg/m. If this is out of the aformentioned range listed, please consider follow up with your Primary Care Provider.   We have sent the following medications to your pharmacy for you to pick up at your convenience:  Zofran  Cholestyramine  Please purchase over the counter Immodium. Take two tablets in the am and then take as needed.  Your physician has requested that you go to the basement for lab work before leaving today.  You have been scheduled for a CT scan of the abdomen and pelvis at [facility] ([address]---this is in the same building as Press photographer).   You are scheduled tomorrow March 28th at 1:00pm. You should arrive 15 minutes prior to your appointment time for registration. Please follow the written instructions below on the day of your exam:  WARNING: IF YOU ARE ALLERGIC TO IODINE/X-RAY DYE, PLEASE NOTIFY RADIOLOGY IMMEDIATELY AT [phone]! YOU WILL BE GIVEN A 13 HOUR PREMEDICATION PREP.  1) Do not eat or drink anything after 9:00am (4 hours prior to your test) No solids. You may have liquids. 2) You have been given 2 bottles of oral contrast to drink. The solution may taste better if refrigerated, but do NOT add ice or any other liquid to this solution. Shake well before drinking.    Drink 1 bottle of contrast @ 11:00am (2 hours prior to your exam)  Drink 1 bottle of contrast @ 12:00pm (1 hour prior to your exam)  You may take any medications as prescribed with a small amount of water except for the following: Metformin, Glucophage, Glucovance, Avandamet, Riomet, Fortamet, Actoplus Met, Janumet, Glumetza or Metaglip. The above  medications must be held the day of the exam AND 48 hours after the exam.  The purpose of you drinking the oral contrast is to aid in the visualization of your intestinal tract. The contrast solution may cause some diarrhea. Before your exam is started, you will be given a small amount of fluid to drink. Depending on your individual set of symptoms, you may also receive an intravenous injection of x-ray contrast/dye. Plan on being at [facility] for 30 minutes or longer, depending on the type of exam you are having performed.  This test typically takes 30-45 minutes to complete.  If you have any questions regarding your exam or if you need to reschedule, you may call the CT department at [phone] between the hours of 8:00 am and 5:00 pm, Monday-Friday.    ________________________________________________________________________

## 2016-08-03 NOTE — Progress Notes (Signed)
HPI :  41 year old female here for follow-up of suspected IBS D / dyspepsia, history of gallstones. See prior notes for details of her history.  Since last visit evaluated her dyspepsia with an ultrasound which showed multiple gallbladder polyps, the largest around 10 mm in diameter. She was referred to general surgery who she saw [facility] and had a cholecystectomy, for which the surgeon reported to her that she did have gallstones in her gallbladder. She initially had resolution of her upper abdominal pain following cholecystectomy but symptoms have since recurred, albeit a bit different.   EGD in Jan 2017 showed a benign hyperplastic gastric polyp. Biopsies for H pylori and celiac were negative. Colonoscopy same dayshowed normal colon without microscopic colitis or inflammatory changes and normal ileum. She incidentally had multple polyps removed, 4 polyps from the right colon removed, roughly 4mm in size, most of these are sessile serrated (pre-cancerous), and she also had some hyperplastic polyps.   Since last visit for her diarrhea she was started on Colestid, and for her dyspepsia place her on Mona. In general she's not been doing too well in recent months.    She reports she feels she has continued to have "gallbladder attacks". By this she means she mostly has pain in her upper abdomen, but also lower abdominal pains in bilateral lower quadrants. She has a "Hot knife" type of pain, cramping, associated with diarrhea. She is taking bentyl which can help somewhat but does not relieve it, taking 10mg  TID PRN. She feels pain mostly all the time at this point, not only related to bowel habits. She feels nauseated, rare vomiting, not much.   She endorses worsening diarrhea, on bad days upwards of "15-20 bowel movements per day", she reports loose / liquid. She reports urgency when she eats. She is taking Coletid TID which is not helping much. At first it helped and then effect wore off. She  stopped Zoloft about one month ago and now on Neurontin, Cymbalta, and Seroquel.  No benefit since stopping zoloft. Eating fatty foods can make symptoms worse. She reports tolerating dairy foods well. She has tried some immodium which made her slightly nauseated, she took 2 tabs once. She has not tried Zofran yet. She thinks she sees oil / fat in her stools.    Past Medical History:  Diagnosis Date  . Anxiety   . Degenerative disc disease, lumbar   . Diarrhea   . Dyspepsia   . Fatty liver   . GERD (gastroesophageal reflux disease)   . IBS (irritable bowel syndrome)   . Personality disorder   . Scoliosis    with associated back pain     Past Surgical History:  Procedure Laterality Date  . CERVICAL BIOPSY  W/ LOOP ELECTRODE EXCISION    . CHOLECYSTECTOMY    . DILATION AND CURETTAGE OF UTERUS     2005  . INCISION AND DRAINAGE PERIRECTAL ABSCESS     Family History  Problem Relation Age of Onset  . Diabetes Mother   . Heart disease Paternal Grandfather   . Hypertension Paternal Grandfather   . Pancreatic cancer Paternal Grandfather   . Lung cancer Paternal Grandfather   . Colon cancer Neg Hx   . Esophageal cancer Neg Hx   . Stomach cancer Neg Hx   . Liver disease Neg Hx    Social History  Substance Use Topics  . Smoking status: Current Every Day Smoker    Packs/day: 0.50    Types: Cigarettes  Start date: 01/06/1997  . Smokeless tobacco: Never Used     Comment: smoked since 20; form given 03-01-16  . Alcohol use No   Current Outpatient Prescriptions  Medication Sig Dispense Refill  . colestipol (COLESTID) 1 g tablet Take 1 tablet (1 g total) by mouth 3 (three) times daily as needed. 90 tablet 2  . dicyclomine (BENTYL) 10 MG capsule TAKE 1 CAPSULE BY MOUTH EVERY 8 HOURS AS NEEDED FOR SPASMS. 60 capsule 1  . DULoxetine (CYMBALTA) 30 MG capsule Take 1 capsule (30 mg total) by mouth daily. 30 capsule 1  . gabapentin (NEURONTIN) 100 MG capsule Take 1 capsule (100 mg total)  by mouth 2 (two) times daily. 60 capsule 1  . Norgestimate-Ethinyl Estradiol Triphasic (ORTHO TRI-CYCLEN LO) 0.18/0.215/0.25 MG-25 MCG tab Take 1 tablet by mouth daily. 1 Package 11  . omeprazole (PRILOSEC) 20 MG capsule TAKE 1 CAPSULE BY MOUTH 2 TIMES DAILY BEFORE A MEAL. 60 capsule 6  . OVER THE COUNTER MEDICATION     . QUEtiapine (SEROQUEL) 100 MG tablet Take 1 tablet (100 mg total) by mouth at bedtime. 30 tablet 1  . traMADol (ULTRAM) 50 MG tablet Take 1 tablet (50 mg total) by mouth every 6 (six) hours as needed. 50 tablet 0  . triamcinolone (KENALOG) 0.025 % ointment Apply 1 application topically 2 (two) times daily. 30 g 0   No current facility-administered medications for this visit.    Allergies  Allergen Reactions  . Penicillins Hives     Review of Systems: All systems reviewed and negative except where noted in HPI.   Lab Results  Component Value Date   WBC 8.9 11/04/2015   HGB 15.5 11/04/2015   HCT 45.7 (H) 11/04/2015   MCV 92.1 11/04/2015   PLT 318 11/04/2015   Lab Results  Component Value Date   ALT 16 01/02/2016   AST 13 01/02/2016   ALKPHOS 60 01/02/2016   BILITOT 0.3 01/02/2016    Lab Results  Component Value Date   CREATININE 1.06 11/04/2015   BUN 6 (L) 11/04/2015   NA 139 11/04/2015   K 4.6 11/04/2015   CL 105 11/04/2015   CO2 24 11/04/2015     Physical Exam: BP 110/80 (BP Location: Left Arm, Patient Position: Sitting, Cuff Size: Normal)   Pulse 68   Ht 5' 5.35" (1.66 m)   Wt 214 lb 2 oz (97.1 kg)   LMP 07/15/2016   BMI 35.25 kg/m  Constitutional: Pleasant,well-developed, female in no acute distress. HEENT: Normocephalic and atraumatic. Conjunctivae are normal. No scleral icterus. Neck supple.  Cardiovascular: Normal rate, regular rhythm.  Pulmonary/chest: Effort normal and breath sounds normal. No wheezing, rales or rhonchi. Abdominal: Soft, nondistended, diffuse mild abdominal tenderness, does not localize, without peritoneal signs or  guarding.  There are no masses palpable. No hepatomegaly. Extremities: no edema Lymphadenopathy: No cervical adenopathy noted. Neurological: Alert and oriented to person place and time. Skin: Skin is warm and dry. No rashes noted. Psychiatric: Normal mood and affect. Behavior is normal.   ASSESSMENT AND PLAN: 41 year old female with history as outlined above here for reassessment of the following issues:  Chronic diarrhea / suspect IBS plus component of post-cholecystectomy diarrhea - initially improved with Colestid however has had severe worsening. We'll check fecal pancreatic elastase to ensure normal given her description of possible steatorrhea. Otherwise we will stop Colestid and switch to cholestyramine which may be stronger regards to treating bile salt component. I also asked her to take Imodium 2  tabs every morning and titrate up as needed. In addition we discussed options for IBS and will start scheduled Zofran 8 mg every 8 hours which will hopefully help her nausea and her loose stools. She can increase her Bentyl to 20 mg every 8 hours as this does provide some benefit. May consider adding IB Gard as well moving forward pending her course. A TCA may be a good option for her however she just started Cymbalta and is on Seroquel, so will hold off on this for now. She agreed and will let me know if no improvement.   Chronic abdominal pain / suspect dyspepsia - she clearly had benefit after cholecystectomy however now has recurrent of upper abdominal pain and lower abdominal pain. I do think she has component of IBS as well as dyspepsia, however symptoms persisting despite variety of management options. Given her severe symptoms will repeat CBC, CMET, and lipase to ensure stable, and I offered her a CT scan of the abdomen and pelvis with contrast to further evaluate given she's had no prior cross-sectional imaging. We'll let her know the results and await her course with the regimen as outlined  above.  Fatty liver disease - on prior ultrasound and previously counseled on this. We'll repeat LFTs to ensure stable.  Kingman Cellar, MD [facility] Pager [phone]

## 2016-08-04 ENCOUNTER — Ambulatory Visit (INDEPENDENT_AMBULATORY_CARE_PROVIDER_SITE_OTHER)
Admission: RE | Admit: 2016-08-04 | Discharge: 2016-08-04 | Disposition: A | Discharge status: Home / Self Care | Payer: Self-pay | Source: Ambulatory Visit | Attending: Gastroenterology | Admitting: Gastroenterology

## 2016-08-04 DIAGNOSIS — K529 Noninfective gastroenteritis and colitis, unspecified: Secondary | ICD-10-CM

## 2016-08-04 DIAGNOSIS — K760 Fatty (change of) liver, not elsewhere classified: Secondary | ICD-10-CM

## 2016-08-04 DIAGNOSIS — R109 Unspecified abdominal pain: Secondary | ICD-10-CM

## 2016-08-04 MED ORDER — IOPAMIDOL (ISOVUE-300) INJECTION 61%
100.0000 mL | Freq: Once | INTRAVENOUS | Status: AC | PRN
  Administered 2016-08-04: 100 mL via INTRAVENOUS

## 2016-08-05 NOTE — Progress Notes (Signed)
   THERAPIST PROGRESS NOTE  Session Time: 57min  Participation Level: Active  Behavioral Response: CasualAlertDepressed  Type of Therapy: Individual Therapy  Treatment Goals addressed: Coping  Interventions: CBT, Motivational Interviewing and Solution Focused  Summary: Maureen Underwood is a 41 y.o. female who presents with continued symptoms of her diagnosis.  Patient describes her mood as angry.  Patient vented her frustration about her current living situation.  Patient reports that she was denied disability and is unable to work due to her gallstones and precancerous cells. Discussion of her options for her happiness.  Encouraged Patient to search for work from home or office part time jobs.  Assisted her with processing boundaries for her husband prior to his release from jail.   Suicidal/Homicidal: No  Therapist Response: LCSW provided Patient with ongoing emotional support and encouragement.  Normalized her feelings.  Commended Patient on her progress and reinforced the importance of client staying focused on her own strengths and resources and resiliency. Processed various strategies for dealing with stressors.    Plan: Return again in 2 weeks.  Diagnosis: Axis I: Mood Disorder    Axis II: No diagnosis    Lubertha South, LCSW 08/04/2016

## 2016-08-16 ENCOUNTER — Ambulatory Visit: Payer: Self-pay | Admitting: Licensed Clinical Social Worker

## 2016-09-08 ENCOUNTER — Ambulatory Visit: Payer: Self-pay | Admitting: Psychiatry

## 2016-09-23 ENCOUNTER — Encounter: Payer: Self-pay | Admitting: Internal Medicine

## 2016-09-27 ENCOUNTER — Ambulatory Visit: Payer: Self-pay | Admitting: Psychiatry

## 2016-09-27 ENCOUNTER — Ambulatory Visit: Payer: Self-pay | Admitting: Licensed Clinical Social Worker

## 2016-10-12 ENCOUNTER — Ambulatory Visit: Payer: Self-pay | Admitting: Obstetrics & Gynecology

## 2016-11-26 ENCOUNTER — Other Ambulatory Visit (HOSPITAL_COMMUNITY)
Admission: RE | Admit: 2016-11-26 | Discharge: 2016-11-26 | Disposition: A | Discharge status: Home / Self Care | Payer: Self-pay | Source: Ambulatory Visit | Attending: Obstetrics & Gynecology | Admitting: Obstetrics & Gynecology

## 2016-11-26 ENCOUNTER — Ambulatory Visit (INDEPENDENT_AMBULATORY_CARE_PROVIDER_SITE_OTHER): Payer: Self-pay | Admitting: Clinical

## 2016-11-26 ENCOUNTER — Ambulatory Visit (INDEPENDENT_AMBULATORY_CARE_PROVIDER_SITE_OTHER): Payer: Self-pay | Admitting: Obstetrics & Gynecology

## 2016-11-26 VITALS — BP 127/68 | HR 74 | Ht 66.0 in | Wt 210.4 lb

## 2016-11-26 DIAGNOSIS — N7689 Other specified inflammation of vagina and vulva: Secondary | ICD-10-CM

## 2016-11-26 DIAGNOSIS — F411 Generalized anxiety disorder: Secondary | ICD-10-CM

## 2016-11-26 DIAGNOSIS — L309 Dermatitis, unspecified: Secondary | ICD-10-CM

## 2016-11-26 NOTE — Patient Instructions (Signed)

## 2016-11-26 NOTE — BH Specialist Note (Signed)
Integrated Behavioral Health Initial Visit  MRN: [id number] Name: PHILA SHOAF   Session Start time: 10:00 Session End time: 10:55 Total time: 1 hour  Type of Service: [place] Interpretor:No. Interpretor Name and Language: n/a   Warm Hand Off Completed.       SUBJECTIVE: Maureen Underwood is a 41 y.o. female accompanied by patient. Patient was referred by Dr Roselie Awkward  for anxiety, depression. Patient reports the following symptoms/concerns: Pt states her primary concern is feeling frustrated over financial and transportation barriers, along with household relationship conflicts; attributes feelings of depression and anxiety to above psychosocial stressors; copes by getting out of the house and taking a walk. Duration of problem: Increase in past three months; Severity of problem: severe  OBJECTIVE: Mood: Anxious, Depressed and Irritable and Affect: Appropriate Risk of harm to self or others: Suicidal ideation No plan to harm self or others   LIFE CONTEXT: Family and Social: Lives with husband(out of jail now), mother-in-law, 15yo stepson School/Work: Unemployed, denied disability twice Self-Care: Walks to [employer] and back sometimes Life Changes: In past year, husband home from incarceration, 15yo son-in-law came to live in home, lost transportation, continual financial instability  GOALS ADDRESSED: Patient will reduce symptoms of: anxiety, depression and stress and increase knowledge and/or ability of: coping skills and also: Increase healthy adjustment to current life circumstances   INTERVENTIONS: Solution-Focused Strategies, Psychoeducation and/or Health Education and Link to [employer]  Standardized Assessments completed: GAD-7 and PHQ 2&9 with C-SSRS  ASSESSMENT: Patient currently experiencing Generalized anxiety disorder and Psychosocial stressors. Patient may benefit from safety plan, psychoeducation and  brief therapeutic interventions regarding coping with symptoms of anxiety and depression.  PLAN: 1. Follow up with behavioral health clinician on : As needed, phone f/u in one week 2. Behavioral recommendations:  -Follow safety plan -Walk @ least 20 minutes/day, outside; more as needed -Consider community resources for food, help with utilities, [place] 3. Referral(s): [facility]) and [employer] Resources:  Hydrologist, [employer] 4. "From scale of 1-10, how likely are you to follow plan?": 8  Garlan Fair, LCSWA  Depression screen [facility] 2/9 11/26/2016 03/23/2016 12/11/2015 11/04/2015 03/19/2015  Decreased Interest 2 1 2 1 1   Down, Depressed, Hopeless 2 1 2 2 1   PHQ - 2 Score 4 2 4 3 2   Altered sleeping 2 1 3 1  0  Tired, decreased energy 2 2 2 1  0  Change in appetite 2 1 2 1  0  Feeling bad or failure about yourself  1 1 2 1  0  Trouble concentrating 1 1 2 1  0  Moving slowly or fidgety/restless 0 0 2 1 1   Suicidal thoughts 1 0 0 1 1  PHQ-9 Score 13 8 17 10 4   Difficult doing work/chores - - - Very difficult -   GAD 7 : Generalized Anxiety Score 11/26/2016 03/23/2016 12/11/2015 11/04/2015  Nervous, Anxious, on Edge 2 1 3 2   Control/stop worrying 3 1 3 3   Worry too much - different things 3 1 3 3   Trouble relaxing 3 0 3 2  Restless 2 1 2 1   Easily annoyed or irritable 3 2 3 3   Afraid - awful might happen 1 1 1 3   Total GAD 7 Score 17 7 18 17   Anxiety Difficulty - - - Very difficult

## 2016-11-26 NOTE — Progress Notes (Signed)
Patient complains of a lump inside of her vagina for 6 months and she would like to get it looked at because sometimes it becomes raw to the touch. Patient not taking any medication not able to pay for medication.

## 2016-11-26 NOTE — Progress Notes (Signed)
Subjective:     Patient ID: Maureen Underwood, female   DOB: 09-23-1975, 41 y.o.   MRN: [id number] Cc: irritation and lump on right vulva [id number] No LMP recorded. Patient is not currently having periods (Reason: Irregular Periods).  Patient complains of a lump ion her right vulva for 6 months and she would like to get it looked at because sometimes it becomes raw to the touch. Patient not taking any medication not able to pay for medication. CT scan done in March showed vascular abnormality or a cervical nabothian cyst  Review of Systems  Genitourinary: Positive for vaginal pain (vulvar irritation). Negative for menstrual problem.       Objective:   Physical Exam  Constitutional: She is oriented to person, place, and time. She appears well-developed. No distress.  Pulmonary/Chest: Effort normal.  Genitourinary: Vagina normal. No vaginal discharge found.  Genitourinary Comments: < 1 cm excoriated lesion right labium major. Cervix normal  Neurological: She is alert and oriented to person, place, and time.  Psychiatric: She has a normal mood and affect. Her behavior is normal.  VULVAR BIOPSY NOTE The indications for vulvar biopsy (rule out neoplasia, establish lichen sclerosus diagnosis) were reviewed.   Risks of the biopsy including pain, bleeding, infection, inadequate specimen, scarring and need for additional procedures  were discussed. The patient stated understanding and agreed to undergo procedure today. Consent was signed,  time out performed.  The patient's vulva was prepped with Betadine. 1% lidocaine was injected into right vulva. A 8-mm punch biopsy was done, biopsy tissue was picked up with sterile forceps and sterile scissors were used to excise the lesion.  Small bleeding was noted and hemostasis was achieved using silver nitrate sticks.  The patient tolerated the procedure well. Post-procedure instructions  (pelvic rest for one week) were given to the patient. The patient is  to call with heavy bleeding, fever greater than 100.4, foul smelling vaginal discharge or other concerns.     CLINICAL DATA:  Status post cholecystectomy October 2017. Persistent generalized abdominal pain and chronic diarrhea.  EXAM: CT ABDOMEN AND PELVIS WITH CONTRAST  TECHNIQUE: Multidetector CT imaging of the abdomen and pelvis was performed using the standard protocol following bolus administration of intravenous contrast.  CONTRAST:  118mL ISOVUE-300 IOPAMIDOL (ISOVUE-300) INJECTION 61%  COMPARISON:  12/19/2015 abdominal sonogram.  FINDINGS: Lower chest: No significant pulmonary nodules or acute consolidative airspace disease.  Hepatobiliary: Diffuse hepatic steatosis. No definite morphologic changes of cirrhosis. No liver masses. Cholecystectomy. No fluid collections in the cholecystectomy bed . No biliary ductal dilatation. Common bile duct diameter 5 mm.  Pancreas: Normal, with no mass or duct dilation.  Spleen: Normal size. No mass.  Adrenals/Urinary Tract: No discrete adrenal nodules. No hydronephrosis. No renal masses. Relatively collapsed and grossly normal bladder.  Stomach/Bowel: Grossly normal stomach. Normal caliber small bowel with no small bowel wall thickening. Normal appendix. Normal large bowel with no diverticulosis, large bowel wall thickening or pericolonic fat stranding.  Vascular/Lymphatic: Normal caliber abdominal aorta. Patent portal, splenic, hepatic and renal veins. No pathologically enlarged lymph nodes in the abdomen or pelvis.  Reproductive: Normal size anteverted uterus. Nonspecific heterogeneous low-attenuation in the cervix. No adnexal masses .  Other: No pneumoperitoneum, ascites or focal fluid collection. Small fat containing umbilical hernia.  Musculoskeletal: No aggressive appearing focal osseous lesions.  IMPRESSION: 1. No acute abnormality. No evidence of bowel obstruction or acute bowel inflammation.  Normal appendix. 2. Nonspecific heterogeneous low-attenuation in the cervix, probably due to benign differential vascular  enhancement and/or nabothian cysts, although correlation with Pap smear and clinical pelvic examination is recommended if not recently performed. 3. Small fat containing umbilical hernia. 4. Diffuse hepatic steatosis.   Electronically Signed   By: Ilona Sorrel M.D.   On: 08/04/2016 14:55 Assessment:     Needs advice to obtain her Rx Vulvar lesion appears due to chronic inflammation    Plan:     Review Bx result Esmeralda Links to see her today  Woodroe Mode, MD 11/26/2016

## 2016-11-26 NOTE — Patient Instructions (Addendum)
My Safety Plan:   Step 1: Warning signs (thoughts, images, mood, situation, behavior) that a crisis may be developing: If I feel like jumping in front of traffic  Step 2: Internal coping strategies: Things I can do to take my mind off my problems without contacting another person (music, relaxation technique, physical activity): Leave house, take a walk by myself   Step 3: People and social settings that provide distraction:  Name and contact: Oscar La, [phone]  Step 4: People I can ask for help:  Name, relationship, contact: Husband, Lennette Bihari [phone] Name, relationship, contact: [place], [place], [phone]  Step 5: Agencies I can contact during a crisis:  Local urgent care services:      1. 9-1-1     2. [facility] (24/7 walk-in) [address], [place], [place]     3. [place]: Intake- [phone]/ [phone]  Closest Urgent Care/Emergency Room Address:  Suicide Prevention Lifeline Phone: [phone] [facility]   Step 6: Making the environment safe- Have friend/family member remove from home: Husband will do this * Weapons in the home * Medication in the home (including Tylenol)  Step 7: The one thing that is most important to me and worth living for is:  My husband and step son  Signature of Patient: _________________________________________________  Signature of Provider: ________________________________________________

## 2016-12-03 ENCOUNTER — Telehealth: Payer: Self-pay | Admitting: Clinical

## 2016-12-03 NOTE — Telephone Encounter (Signed)
Follow-up call, as requested by patient; Left HIPPA-compliant message to return call to [facility] at [facility] for [facility] at [phone].   Patient is not taking medications, as she has no income to pay copays. Pt will receive a phone call from [facility] within one week(12-10-16) with any information regarding programs that may, or may not, be able to help patients obtain medications through [facility] pharmacy.

## 2016-12-06 ENCOUNTER — Telehealth: Payer: Self-pay | Admitting: *Deleted

## 2016-12-06 NOTE — Telephone Encounter (Signed)
-----   Message from Woodroe Mode, MD sent at 12/06/2016  9:37 AM EDT ----- Benign skin biopsy

## 2016-12-06 NOTE — Telephone Encounter (Signed)
I called Maureen Underwood per Dr. Jordan Hawks message and notified her that her skin biopsy was negative. She voices understanding and relief.

## 2016-12-07 ENCOUNTER — Ambulatory Visit: Payer: Self-pay | Attending: Internal Medicine | Admitting: Internal Medicine

## 2016-12-07 ENCOUNTER — Encounter: Payer: Self-pay | Admitting: Internal Medicine

## 2016-12-07 VITALS — BP 116/84 | HR 81 | Temp 98.6°F | Resp 16 | Wt 212.0 lb

## 2016-12-07 DIAGNOSIS — L089 Local infection of the skin and subcutaneous tissue, unspecified: Secondary | ICD-10-CM

## 2016-12-07 DIAGNOSIS — Z860100 Personal history of colon polyps, unspecified: Secondary | ICD-10-CM

## 2016-12-07 DIAGNOSIS — L729 Follicular cyst of the skin and subcutaneous tissue, unspecified: Secondary | ICD-10-CM | POA: Insufficient documentation

## 2016-12-07 DIAGNOSIS — G8929 Other chronic pain: Secondary | ICD-10-CM

## 2016-12-07 DIAGNOSIS — F17210 Nicotine dependence, cigarettes, uncomplicated: Secondary | ICD-10-CM | POA: Insufficient documentation

## 2016-12-07 DIAGNOSIS — F419 Anxiety disorder, unspecified: Secondary | ICD-10-CM | POA: Insufficient documentation

## 2016-12-07 DIAGNOSIS — Z760 Encounter for issue of repeat prescription: Secondary | ICD-10-CM | POA: Insufficient documentation

## 2016-12-07 DIAGNOSIS — M545 Low back pain: Secondary | ICD-10-CM | POA: Insufficient documentation

## 2016-12-07 DIAGNOSIS — K580 Irritable bowel syndrome with diarrhea: Secondary | ICD-10-CM

## 2016-12-07 DIAGNOSIS — Z720 Tobacco use: Secondary | ICD-10-CM

## 2016-12-07 DIAGNOSIS — Z79899 Other long term (current) drug therapy: Secondary | ICD-10-CM | POA: Insufficient documentation

## 2016-12-07 DIAGNOSIS — F329 Major depressive disorder, single episode, unspecified: Secondary | ICD-10-CM | POA: Insufficient documentation

## 2016-12-07 DIAGNOSIS — K219 Gastro-esophageal reflux disease without esophagitis: Secondary | ICD-10-CM

## 2016-12-07 DIAGNOSIS — M5416 Radiculopathy, lumbar region: Secondary | ICD-10-CM

## 2016-12-07 DIAGNOSIS — F32A Depression, unspecified: Secondary | ICD-10-CM

## 2016-12-07 DIAGNOSIS — Z880 Allergy status to penicillin: Secondary | ICD-10-CM | POA: Insufficient documentation

## 2016-12-07 MED ORDER — DULOXETINE HCL 30 MG PO CPEP: 30.0000 mg | ORAL_CAPSULE | Freq: Every day | ORAL | 1 refills | Status: DC

## 2016-12-07 MED ORDER — COLESTIPOL HCL 1 G PO TABS: 1.0000 g | ORAL_TABLET | Freq: Three times a day (TID) | ORAL | 2 refills | Status: DC | PRN

## 2016-12-07 MED ORDER — DICYCLOMINE HCL 10 MG PO CAPS: ORAL_CAPSULE | ORAL | 1 refills | Status: DC

## 2016-12-07 MED ORDER — CHOLESTYRAMINE POWD: 4.0000 g | Freq: Every day | 1 refills | Status: DC

## 2016-12-07 MED ORDER — OMEPRAZOLE 20 MG PO CPDR: DELAYED_RELEASE_CAPSULE | ORAL | 6 refills | Status: DC

## 2016-12-07 MED ORDER — SULFAMETHOXAZOLE-TRIMETHOPRIM 800-160 MG PO TABS: 1.0000 | ORAL_TABLET | Freq: Two times a day (BID) | ORAL | 0 refills | Status: DC

## 2016-12-07 MED ORDER — NORGESTIM-ETH ESTRAD TRIPHASIC 0.18/0.215/0.25 MG-25 MCG PO TABS: 1.0000 | ORAL_TABLET | Freq: Every day | ORAL | 11 refills | Status: DC

## 2016-12-07 MED ORDER — QUETIAPINE FUMARATE 100 MG PO TABS: 100.0000 mg | ORAL_TABLET | Freq: Every day | ORAL | 1 refills | Status: DC

## 2016-12-07 MED ORDER — GABAPENTIN 100 MG PO CAPS: 100.0000 mg | ORAL_CAPSULE | Freq: Two times a day (BID) | ORAL | 1 refills | Status: DC

## 2016-12-07 MED ORDER — TRAMADOL HCL 50 MG PO TABS: 50.0000 mg | ORAL_TABLET | Freq: Four times a day (QID) | ORAL | 0 refills | Status: DC | PRN

## 2016-12-07 MED ORDER — ONDANSETRON 8 MG PO TBDP: 8.0000 mg | ORAL_TABLET | Freq: Three times a day (TID) | ORAL | 0 refills | Status: DC | PRN

## 2016-12-07 MED FILL — ONDANSETRON ODT 8 MG TABLET: 8 | 6 days supply | Qty: 20 | Fill #0

## 2016-12-07 MED FILL — SULFAMETHOXAZOLE-TMP DS TAB: 800-160 | 7 days supply | Qty: 14 | Fill #0

## 2016-12-07 MED FILL — GABAPENTIN 100 MG CAPSULE: 100 | 30 days supply | Qty: 60 | Fill #0

## 2016-12-07 MED FILL — ?DULOXETINE HCL DR 30 MG CA: 30 MG | 30 days supply | Qty: 30 | Fill #0

## 2016-12-07 MED FILL — ?OMEPRAZOLE DR 20 MG CAPSUL: 20 | 30 days supply | Qty: 60 | Fill #0

## 2016-12-07 NOTE — Progress Notes (Signed)
Patient ID: Maureen Underwood, female    DOB: 01-19-1976  MRN: [id number]  CC: re-establish and Cyst   Subjective: Maureen Underwood is a 41 y.o. female who presents today to become est with me as PCP and for med refills Her concerns today include:  Maureen Underwood is a 41 y.o. female here today for a follow up visit, last seen June 2017.  Hx of IBS, /anxiety/depression, chronic back pain for djd l4-s1 on MIR 11/16 w/o sciatica for which she is on Ultram, GB removal 10/10 with subsequent continue abdominal pain and diarrhea thought to be due to IBS and followed by GI Dr. Enis Gash.  1.  Requesting all of her meds be transferred to [place] as she is currently uninsured and unable to afford at outside pharmacy . 2. after GB removal, she was still having some GI issues  - dyspepsia and diarrhea. Had EGD and colonoscopy. Several precancers polyps removed during colonoscopy. Dr. Enis Gash wants her to try Cholestyramine, Bentyl. Did not get the former as yet due to cost   3. Depression/anxiety: followed at psychiatry at [facility]. -was on Zoloft and Seraquil. Zoloft d/c Neurontin and Cymbalta started. Not able to afford. Hopes she can get today at our pharmacy  4. Tob: down fronm 3/4 to 1/4 pk. Trying to quit -told she has "cancer gene" so really trying to quit  5. Chronic LBP Request RF on Tramadol which she uses 1-2 x a wk -last pain management with controlled substance agreement signed 10/2016  6.  C/o painful nodule RT upper inner thigh x 6 mths. Increasing in size and very sore.  -last several days she has been doing sitz baths hoping it would come to head but so far no drainage.  Patient Active Problem List   Diagnosis Date Noted  . Idiopathic scoliosis 11/04/2015  . Chronic radicular pain of lower back 11/04/2015  . Tobacco abuse 11/04/2015  . Diarrhea 03/27/2015  . HLD (hyperlipidemia) 12/06/2014  . HSIL (high grade squamous intraepithelial lesion) on Pap smear of  cervix 12/06/2014  . Anxiety 11/27/2014  . Depression 11/27/2014     Current Outpatient Prescriptions on File Prior to Visit  Medication Sig Dispense Refill  . OVER THE COUNTER MEDICATION      No current facility-administered medications on file prior to visit.     Allergies  Allergen Reactions  . Penicillins Hives    Social History   Social History  . Marital status: Married    Spouse name: N/A  . Number of children: N/A  . Years of education: N/A   Occupational History  . Not on file.   Social History Main Topics  . Smoking status: Current Every Day Smoker    Packs/day: 0.50    Types: Cigarettes    Start date: 01/06/1997  . Smokeless tobacco: Never Used     Comment: smoked since 20; form given 03-01-16  . Alcohol use No  . Drug use: No  . Sexual activity: Not Currently    Birth control/ protection: None   Other Topics Concern  . Not on file   Social History Narrative  . No narrative on file    Family History  Problem Relation Age of Onset  . Diabetes Mother   . Heart disease Paternal Grandfather   . Hypertension Paternal Grandfather   . Pancreatic cancer Paternal Grandfather   . Lung cancer Paternal Grandfather   . Colon cancer Neg Hx   . Esophageal cancer Neg Hx   .  Stomach cancer Neg Hx   . Liver disease Neg Hx     Past Surgical History:  Procedure Laterality Date  . CERVICAL BIOPSY  W/ LOOP ELECTRODE EXCISION    . CHOLECYSTECTOMY    . DILATION AND CURETTAGE OF UTERUS     2005  . INCISION AND DRAINAGE PERIRECTAL ABSCESS      ROS: Review of Systems Negative except as stated above PHYSICAL EXAM: BP 116/84   Pulse 81   Temp 98.6 F (37 C) (Oral)   Resp 16   Wt 212 lb (96.2 kg)   SpO2 98%   BMI 34.22 kg/m   Physical Exam General appearance - alert, well appearing, and in no distress Mental status - alert, oriented to person, place, and time, normal mood, behavior, speech, dress, motor activity, and thought processes Neck - supple, no  significant adenopathy Chest - clear to auscultation, no wheezes, rales or rhonchi, symmetric air entry Heart - normal rate, regular rhythm, normal S1, S2, no murmurs, rubs, clicks or gallops Extremities - peripheral pulses normal, no pedal edema, no clubbing or cyanosis Skin - Maureen Underwood CMA present: 6-7 cm firm, mildly tender linear mass. Small head noted but no puss expressed. No erythema.   Depression screen PHQ 2/9 12/07/2016  Decreased Interest 2  Down, Depressed, Hopeless 3  PHQ - 2 Score 5  Altered sleeping 2  Tired, decreased energy 3  Change in appetite 2  Feeling bad or failure about yourself  2  Trouble concentrating 2  Moving slowly or fidgety/restless 1  Suicidal thoughts 1  PHQ-9 Score 18  Difficult doing work/chores -  Some recent data might be hidden   ASSESSMENT AND PLAN: 1. Gastroesophageal reflux disease without esophagitis - omeprazole (PRILOSEC) 20 MG capsule; TAKE 1 CAPSULE BY MOUTH 2 TIMES DAILY BEFORE A MEAL.  Dispense: 60 capsule; Refill: 6 - ondansetron (ZOFRAN ODT) 8 MG disintegrating tablet; Take 1 tablet (8 mg total) by mouth every 8 (eight) hours as needed for nausea or vomiting.  Dispense: 20 tablet; Refill: 0  2. Irritable bowel syndrome with diarrhea -rxns sent by Dr. Enis Gash rewritten and sent to [facility]  3. Infected cyst of skin - Ambulatory referral to General Surgery - sulfamethoxazole-trimethoprim (BACTRIM DS) 800-160 MG tablet; Take 1 tablet by mouth 2 (two) times daily.  Dispense: 14 tablet; Refill: 0  4. Anxiety and depression - DULoxetine (CYMBALTA) 30 MG capsule; Take 1 capsule (30 mg total) by mouth daily.  Dispense: 30 capsule; Refill: 1 - gabapentin (NEURONTIN) 100 MG capsule; Take 1 capsule (100 mg total) by mouth 2 (two) times daily.  Dispense: 60 capsule; Refill: 1 - QUEtiapine (SEROQUEL) 100 MG tablet; Take 1 tablet (100 mg total) by mouth at bedtime.  Dispense: 30 tablet; Refill: 1  5. Tobacco abuse -encouraged her to  continue to cut down with goals of quitting  6. Chronic radicular pain of lower back -risk of serotoin syndrome with Cymbalta and Tramadol. However, pt uses the latter sparingly and not on daily bases. I went over signs/symptoms to watch for that would indicate need to stop med and be seen including  Fever, fluctuating BP, mental confusion etc.  - traMADol (ULTRAM) 50 MG tablet; Take 1 tablet (50 mg total) by mouth every 6 (six) hours as needed.  Dispense: 50 tablet; Refill: 0  Patient was given the opportunity to ask questions.  Patient verbalized understanding of the plan and was able to repeat key elements of the plan.   Orders Placed  This Encounter  Procedures  . Ambulatory referral to General Surgery     Requested Prescriptions   Signed Prescriptions Disp Refills  . DULoxetine (CYMBALTA) 30 MG capsule 30 capsule 1    Sig: Take 1 capsule (30 mg total) by mouth daily.  Marland Kitchen gabapentin (NEURONTIN) 100 MG capsule 60 capsule 1    Sig: Take 1 capsule (100 mg total) by mouth 2 (two) times daily.  . Norgestimate-Ethinyl Estradiol Triphasic (ORTHO TRI-CYCLEN LO) 0.18/0.215/0.25 MG-25 MCG tab 1 Package 11    Sig: Take 1 tablet by mouth daily.  Marland Kitchen omeprazole (PRILOSEC) 20 MG capsule 60 capsule 6    Sig: TAKE 1 CAPSULE BY MOUTH 2 TIMES DAILY BEFORE A MEAL.  Marland Kitchen ondansetron (ZOFRAN ODT) 8 MG disintegrating tablet 20 tablet 0    Sig: Take 1 tablet (8 mg total) by mouth every 8 (eight) hours as needed for nausea or vomiting.  Marland Kitchen QUEtiapine (SEROQUEL) 100 MG tablet 30 tablet 1    Sig: Take 1 tablet (100 mg total) by mouth at bedtime.  . traMADol (ULTRAM) 50 MG tablet 50 tablet 0    Sig: Take 1 tablet (50 mg total) by mouth every 6 (six) hours as needed.  . sulfamethoxazole-trimethoprim (BACTRIM DS) 800-160 MG tablet 14 tablet 0    Sig: Take 1 tablet by mouth 2 (two) times daily.  . Cholestyramine POWD 500 g 1    Sig: 4 g by Does not apply route daily.  . colestipol (COLESTID) 1 g tablet 90 tablet 2      Sig: Take 1 tablet (1 g total) by mouth 3 (three) times daily as needed.  . dicyclomine (BENTYL) 10 MG capsule 60 capsule 1    Sig: TAKE 1 CAPSULE BY MOUTH EVERY 8 HOURS AS NEEDED FOR SPASMS.    Return in about 3 months (around 03/09/2017), or if symptoms worsen or fail to improve.  Karle Plumber, MD, FACP

## 2016-12-07 NOTE — Progress Notes (Signed)
Pt is going to get meds filled today

## 2016-12-08 DIAGNOSIS — Z86010 Personal history of colonic polyps: Secondary | ICD-10-CM | POA: Insufficient documentation

## 2016-12-08 MED FILL — DULoxetine HCL 30 MG CPEP: 30 | 30 days supply | Qty: 30 | Fill #0

## 2016-12-08 MED FILL — QUETIAPINE FUMARATE 100 MG: 100 | 30 days supply | Qty: 30 | Fill #0

## 2016-12-08 MED FILL — COLESTIPOL HCL 1 GM TABLET: 1 | 30 days supply | Qty: 90 | Fill #0

## 2016-12-08 MED FILL — GABAPENTIN 100 MG CAPSULE: 100 | 30 days supply | Qty: 60 | Fill #0

## 2016-12-08 MED FILL — DICYCLOMINE 10 MG CAPSULE: 10 | 20 days supply | Qty: 60 | Fill #0

## 2016-12-08 MED FILL — traMADol HCL 50 MG TABS: 50 | 12 days supply | Qty: 50 | Fill #0

## 2016-12-08 MED FILL — ONDANSETRON ODT 8 MG TABLET: 8 | 7 days supply | Qty: 20 | Fill #0

## 2016-12-08 MED FILL — TRI-LO-SPRINTEC TABLET: 0.18/0.215/ | 28 days supply | Qty: 28 | Fill #0

## 2016-12-08 MED FILL — CHOLESTYRAMINE LIGHT POWDER: 4 | 12 days supply | Qty: 210 | Fill #0

## 2016-12-15 ENCOUNTER — Ambulatory Visit: Payer: Self-pay | Attending: Internal Medicine

## 2016-12-20 ENCOUNTER — Ambulatory Visit (INDEPENDENT_AMBULATORY_CARE_PROVIDER_SITE_OTHER): Payer: No Typology Code available for payment source | Admitting: Surgery

## 2016-12-20 ENCOUNTER — Encounter: Payer: Self-pay | Admitting: Surgery

## 2016-12-20 VITALS — BP 124/85 | HR 74 | Temp 97.6°F | Ht 66.0 in | Wt 211.2 lb

## 2016-12-20 DIAGNOSIS — L723 Sebaceous cyst: Secondary | ICD-10-CM

## 2016-12-20 NOTE — Progress Notes (Signed)
Maureen Underwood is an 41 y.o. female.   Chief Complaint: groin cyst HPI: This patient with a 6 month history of a right groin mass. She was placed on antibiotics recently and it decreased in size and has drained once before a very minimal amount of pinkish fluid. It was causing her pain at one point but no longer. It has been growing in size.  Past Medical History:  Diagnosis Date  . Anxiety   . Degenerative disc disease, lumbar   . Diarrhea   . Dyspepsia   . Fatty liver   . GERD (gastroesophageal reflux disease)   . IBS (irritable bowel syndrome)   . Personality disorder   . Scoliosis    with associated back pain    Past Surgical History:  Procedure Laterality Date  . CERVICAL BIOPSY  W/ LOOP ELECTRODE EXCISION    . CHOLECYSTECTOMY    . DILATION AND CURETTAGE OF UTERUS     2005  . INCISION AND DRAINAGE PERIRECTAL ABSCESS      Family History  Problem Relation Age of Onset  . Diabetes Mother   . Diabetes Maternal Grandmother   . Hyperlipidemia Maternal Grandmother   . Heart disease Maternal Grandfather   . Hypertension Maternal Grandfather   . Lung cancer Maternal Grandfather   . Pancreatic cancer Maternal Grandfather   . Colon cancer Neg Hx   . Esophageal cancer Neg Hx   . Stomach cancer Neg Hx   . Liver disease Neg Hx    Social History:  reports that she has been smoking Cigarettes.  She started smoking about 19 years ago. She has been smoking about 0.25 packs per day. She has never used smokeless tobacco. She reports that she does not drink alcohol or use drugs.  Allergies:  Allergies  Allergen Reactions  . Penicillins Hives     (Not in a hospital admission)   Review of Systems:   Review of Systems  Constitutional: Negative.   HENT: Negative.   Eyes: Negative.   Respiratory: Negative.   Cardiovascular: Negative.   Gastrointestinal: Negative.   Genitourinary: Negative.   Musculoskeletal: Negative.   Skin: Negative.   Neurological: Negative.    Endo/Heme/Allergies: Negative.   Psychiatric/Behavioral: Negative.     Physical Exam:  Physical Exam  Constitutional: She is oriented to person, place, and time and well-developed, well-nourished, and in no distress. No distress.  Obese female in no acute distress  HENT:  Head: Normocephalic and atraumatic.  Eyes: Pupils are equal, round, and reactive to light. Right eye exhibits no discharge. Left eye exhibits no discharge. No scleral icterus.  Cardiovascular: Normal rate and regular rhythm.   Pulmonary/Chest: Effort normal. No respiratory distress.  Neurological: She is alert and oriented to person, place, and time.  Skin: Skin is warm and dry. No rash noted. She is not diaphoretic. No erythema.  Right groin mass in the perineal area lateral to the vulva but on the medial thigh. It measures approximately 1 x 4 cm with no erythema no drainage no tenderness  Psychiatric: Mood and affect normal.  Vitals reviewed.   Blood pressure 124/85, pulse 74, temperature 97.6 F (36.4 C), temperature source Oral, height 5\' 6"  (1.676 m), weight 211 lb 3.2 oz (95.8 kg).    No results found for this or any previous visit (from the past 48 hour(s)). No results found.   Assessment/Plan Right groin mass in the medial thigh. This likely represents a previously infected sebaceous cyst or hair follicle. Currently it is  not inflamed. I discussed with the patient the options of observation versus surgical excision. It has not caused her much trouble and is currently much smaller with no pain with that in mind and recommended observation only. She can return at any time to have it examined.  Florene Glen, MD, FACS

## 2016-12-20 NOTE — Patient Instructions (Signed)
If your symptoms become worse and need to be seen please give our office a call.

## 2017-02-11 ENCOUNTER — Other Ambulatory Visit: Payer: Self-pay | Admitting: Internal Medicine

## 2017-02-11 DIAGNOSIS — L729 Follicular cyst of the skin and subcutaneous tissue, unspecified: Secondary | ICD-10-CM

## 2017-02-11 DIAGNOSIS — M5416 Radiculopathy, lumbar region: Principal | ICD-10-CM

## 2017-02-11 DIAGNOSIS — G8929 Other chronic pain: Secondary | ICD-10-CM

## 2017-02-11 DIAGNOSIS — F329 Major depressive disorder, single episode, unspecified: Secondary | ICD-10-CM

## 2017-02-11 DIAGNOSIS — F419 Anxiety disorder, unspecified: Secondary | ICD-10-CM

## 2017-02-11 DIAGNOSIS — L089 Local infection of the skin and subcutaneous tissue, unspecified: Secondary | ICD-10-CM

## 2017-02-11 DIAGNOSIS — F32A Depression, unspecified: Secondary | ICD-10-CM

## 2017-02-13 ENCOUNTER — Encounter: Payer: Self-pay | Admitting: Internal Medicine

## 2017-02-13 NOTE — Progress Notes (Signed)
Received request for RF on Tramadol. [place] reviewed and is appropriate. Controlled Substance Prescribing agreement is up to date. RF will be printed and pt contacted to pick up.

## 2017-02-15 ENCOUNTER — Other Ambulatory Visit: Payer: Self-pay | Admitting: Internal Medicine

## 2017-02-15 DIAGNOSIS — G8929 Other chronic pain: Secondary | ICD-10-CM

## 2017-02-15 DIAGNOSIS — M5416 Radiculopathy, lumbar region: Principal | ICD-10-CM

## 2017-02-15 MED ORDER — TRAMADOL HCL 50 MG PO TABS: 50.0000 mg | ORAL_TABLET | Freq: Four times a day (QID) | ORAL | 0 refills | Status: DC | PRN

## 2017-02-22 MED FILL — ?DULOXETINE HCL DR 30 MG CA: 30 MG | 30 days supply | Qty: 30 | Fill #0

## 2017-02-22 MED FILL — QUETIAPINE FUMARATE 100 MG: 100 | 30 days supply | Qty: 30 | Fill #0

## 2017-02-22 MED FILL — SULFAMETHOXAZOLE-TMP DS TAB: 800-160 | 7 days supply | Qty: 14 | Fill #0

## 2017-02-22 MED FILL — GABAPENTIN 100 MG CAPSULE: 100 | 30 days supply | Qty: 60 | Fill #1

## 2017-04-15 ENCOUNTER — Other Ambulatory Visit: Payer: Self-pay | Admitting: Psychiatry

## 2017-04-15 ENCOUNTER — Other Ambulatory Visit: Payer: Self-pay | Admitting: Internal Medicine

## 2017-04-15 DIAGNOSIS — F419 Anxiety disorder, unspecified: Principal | ICD-10-CM

## 2017-04-15 DIAGNOSIS — F329 Major depressive disorder, single episode, unspecified: Secondary | ICD-10-CM

## 2017-04-15 DIAGNOSIS — F32A Depression, unspecified: Secondary | ICD-10-CM

## 2017-04-28 IMAGING — US US ABDOMEN LIMITED
1 series · 14 of 25 positions shown · non-contrast
Comparison: None.

CLINICAL DATA: Right upper quadrant pain.

EXAM:
US ABDOMEN LIMITED - RIGHT UPPER QUADRANT

[Series 1: us abdomen limited · 0.24mm/px · 14 of 64 slices shown]
[im 1/64]
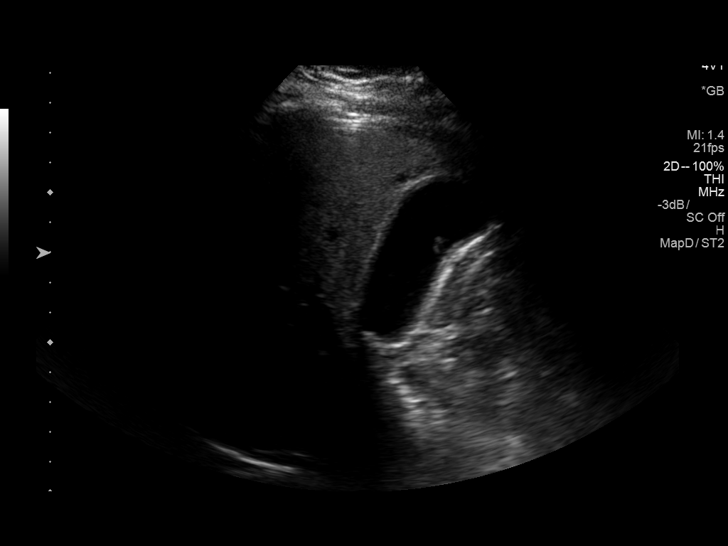
[im 6/64]
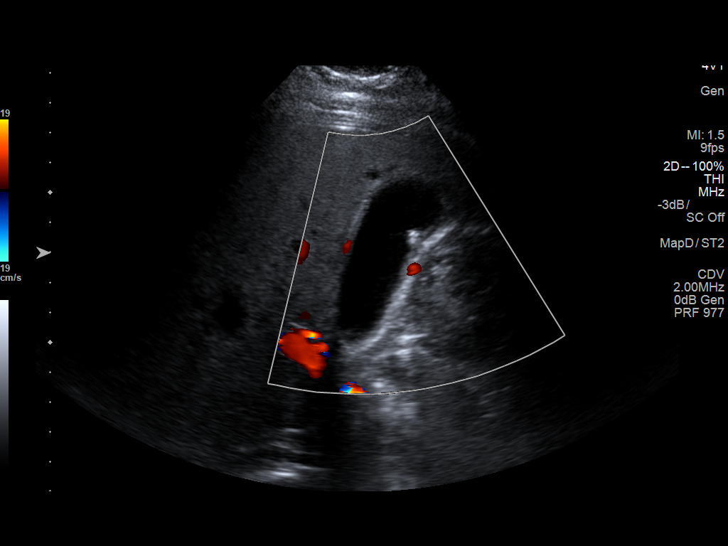
[im 11/64]
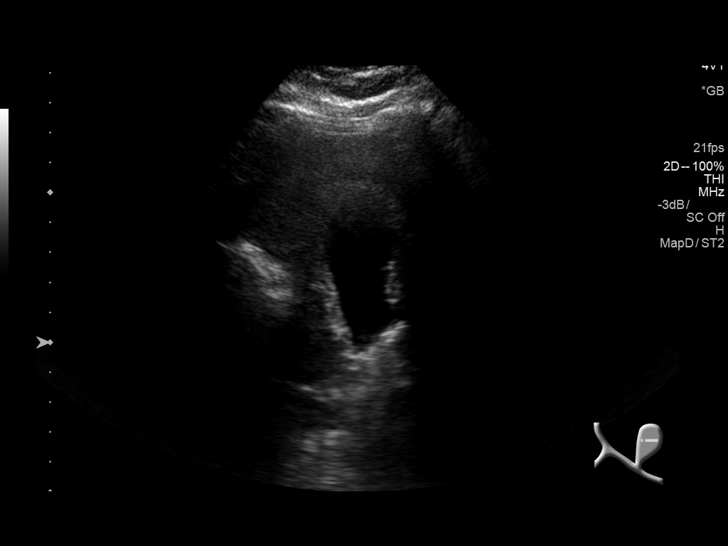
[im 16/64]
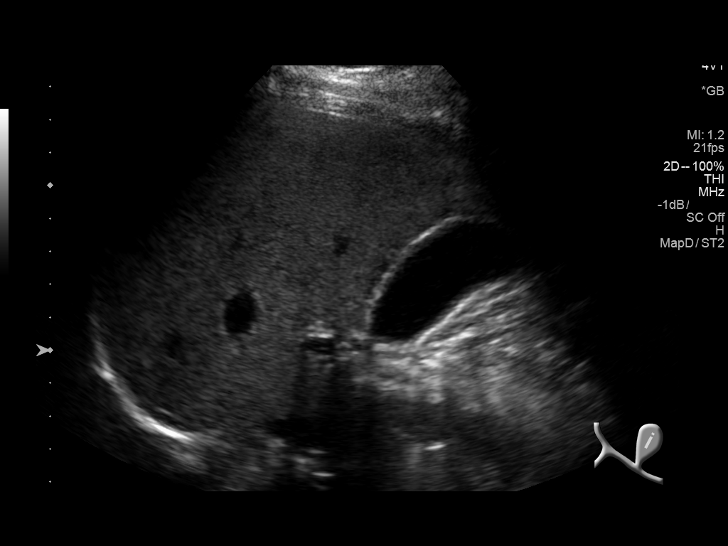
[im 22/64]
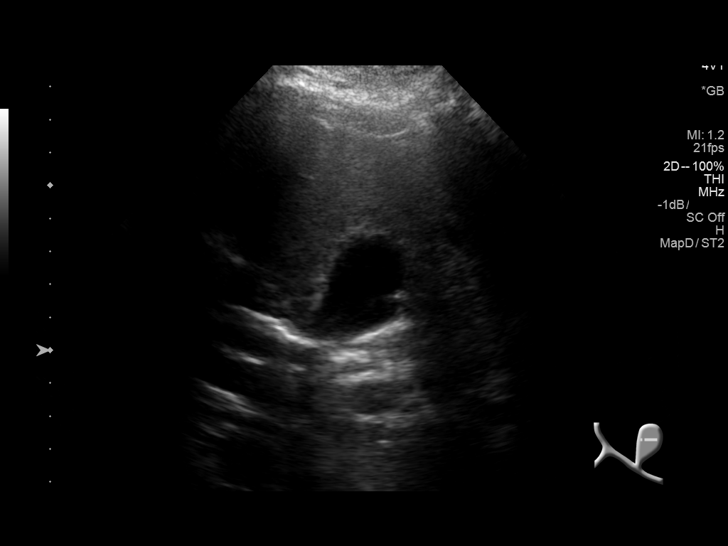
[im 24/64]
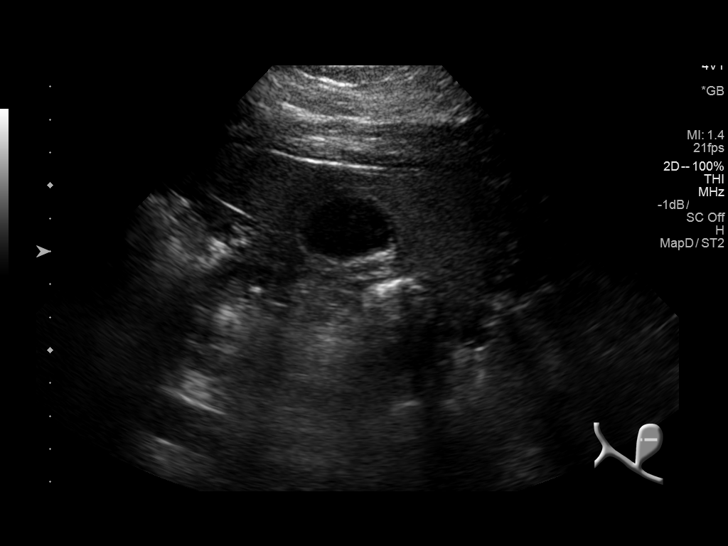
[im 29/64]
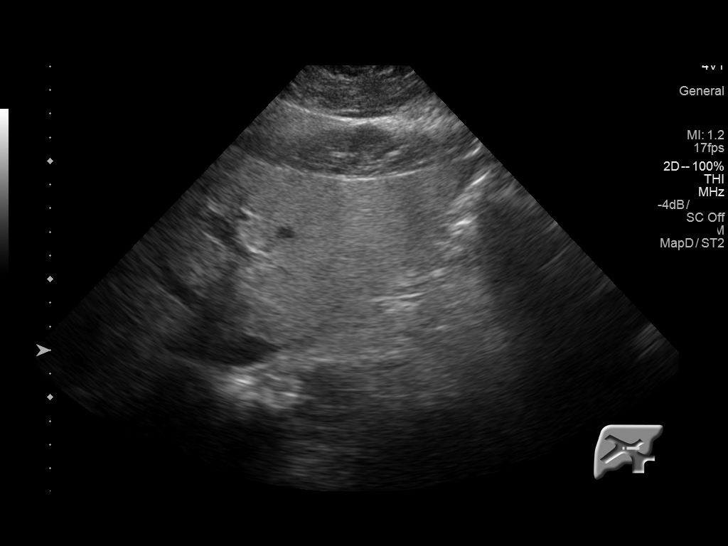
[im 35/64]
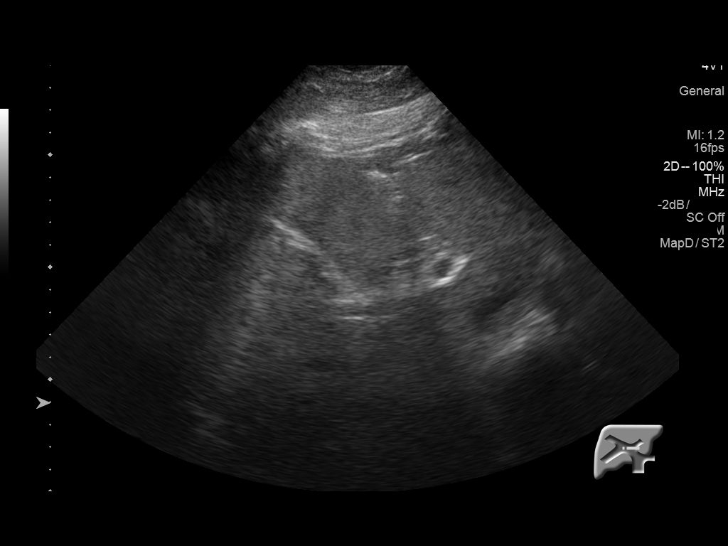
[im 40/64]
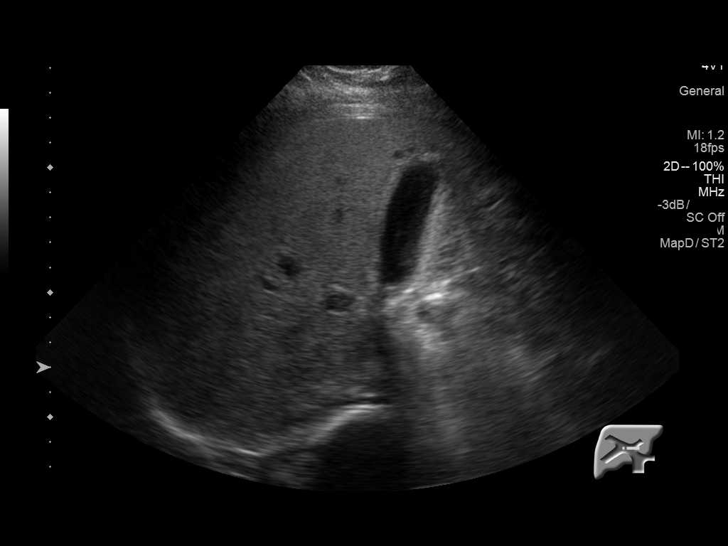
[im 43/64]
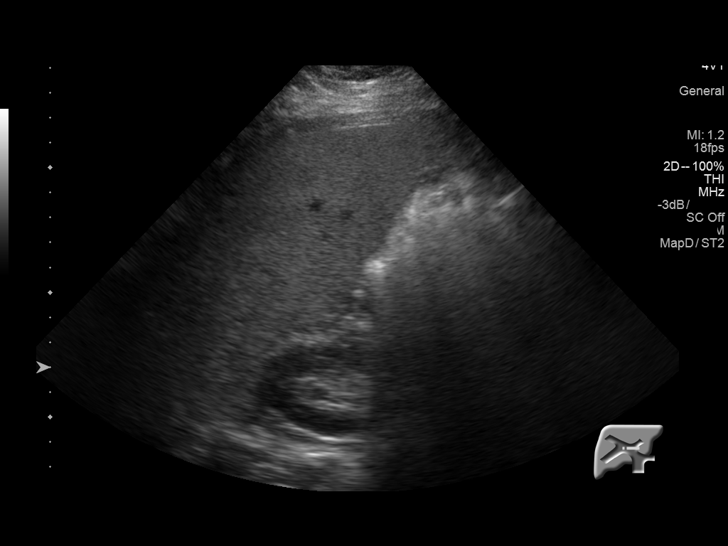
[im 48/64]
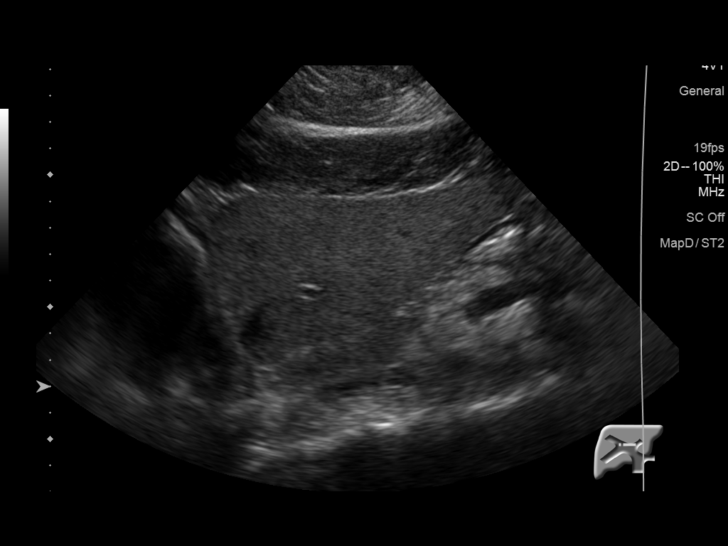
[im 53/64]
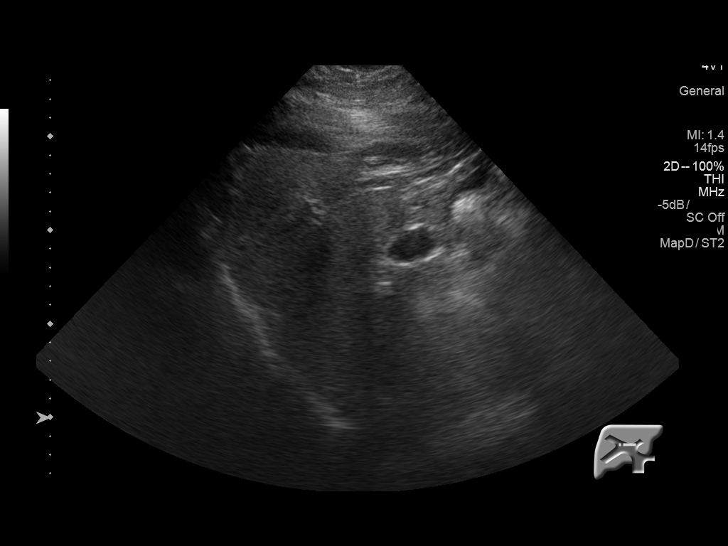
[im 58/64]
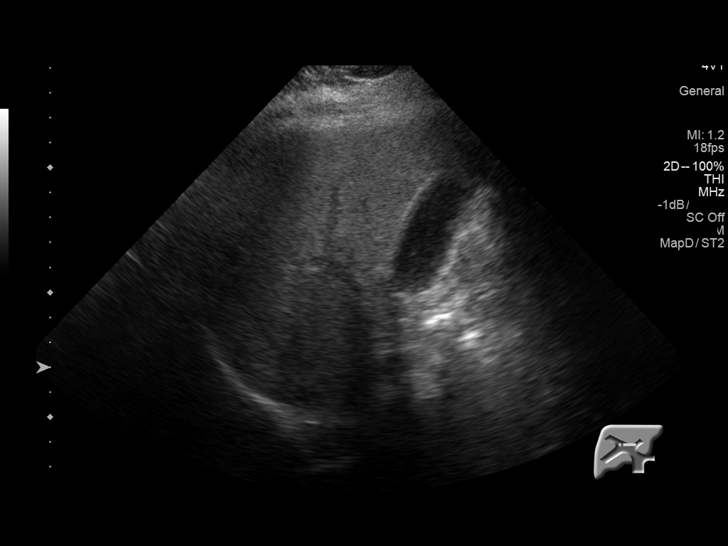
[im 64/64]
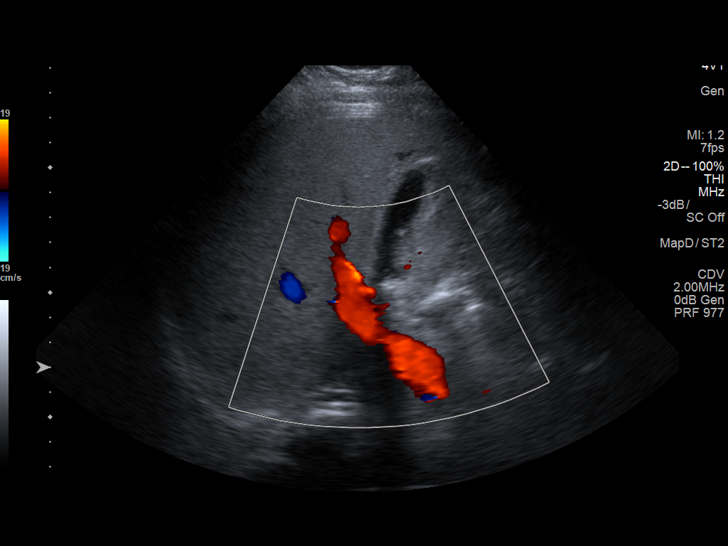

[14 of 25 positions shown; findings below may reference images not displayed]

FINDINGS: Gallbladder:

Multiple gallbladder polyps are present. At least 3-4 are identified
with the largest measuring at least 0.7 cm and potentially as much
as around 10 mm in maximum diameter. No mobile calculi, gallbladder
wall thickening or sonographic Murphy's sign.

Common bile duct:

Diameter: Normal caliber of 3 mm.

Liver:

The liver demonstrates coarse echotexture and increased
echogenicity, likely reflecting diffuse steatosis. No overt
cirrhotic contour abnormalities or focal lesions are identified.
There is no evidence of intrahepatic biliary ductal dilatation. The
portal vein is open.
IMPRESSION: 1. Multiple gallbladder polyps. The largest measures approximately
7-10 mm in maximum diameter.
2. Diffuse hepatic steatosis.

## 2017-05-11 MED FILL — ?DULOXETINE HCL DR 30 MG CA: 30 MG | 30 days supply | Qty: 30 | Fill #1

## 2017-05-11 MED FILL — QUETIAPINE FUMARATE 100 MG: 100 | 30 days supply | Qty: 30 | Fill #1

## 2017-05-11 MED FILL — GABAPENTIN 100 MG CAPSULE: 100 | 30 days supply | Qty: 60 | Fill #1

## 2017-06-13 ENCOUNTER — Encounter: Payer: Self-pay | Admitting: Gastroenterology

## 2017-12-21 ENCOUNTER — Encounter: Payer: Self-pay | Admitting: Emergency Medicine

## 2017-12-21 ENCOUNTER — Other Ambulatory Visit: Payer: Self-pay

## 2017-12-21 ENCOUNTER — Emergency Department: Payer: Self-pay

## 2017-12-21 ENCOUNTER — Emergency Department
Admission: EM | Admit: 2017-12-21 | Discharge: 2017-12-21 | Disposition: A | Discharge status: Home / Self Care | Payer: Self-pay | Attending: Emergency Medicine | Admitting: Emergency Medicine

## 2017-12-21 DIAGNOSIS — F17210 Nicotine dependence, cigarettes, uncomplicated: Secondary | ICD-10-CM | POA: Insufficient documentation

## 2017-12-21 DIAGNOSIS — Y999 Unspecified external cause status: Secondary | ICD-10-CM | POA: Insufficient documentation

## 2017-12-21 DIAGNOSIS — Y939 Activity, unspecified: Secondary | ICD-10-CM | POA: Insufficient documentation

## 2017-12-21 DIAGNOSIS — Z79899 Other long term (current) drug therapy: Secondary | ICD-10-CM | POA: Insufficient documentation

## 2017-12-21 DIAGNOSIS — S92911A Unspecified fracture of right toe(s), initial encounter for closed fracture: Secondary | ICD-10-CM

## 2017-12-21 DIAGNOSIS — S92514A Nondisplaced fracture of proximal phalanx of right lesser toe(s), initial encounter for closed fracture: Secondary | ICD-10-CM | POA: Insufficient documentation

## 2017-12-21 DIAGNOSIS — Y92009 Unspecified place in unspecified non-institutional (private) residence as the place of occurrence of the external cause: Secondary | ICD-10-CM | POA: Insufficient documentation

## 2017-12-21 MED ORDER — HYDROCODONE-ACETAMINOPHEN 5-325 MG PO TABS
1.0000 | ORAL_TABLET | Freq: Once | ORAL | Status: AC
  Administered 2017-12-21: 1 via ORAL
  Filled 2017-12-21: qty 1

## 2017-12-21 MED ORDER — HYDROCODONE-ACETAMINOPHEN 5-325 MG PO TABS: 1.0000 | ORAL_TABLET | Freq: Four times a day (QID) | ORAL | 0 refills | Status: DC | PRN

## 2017-12-21 MED ORDER — IBUPROFEN 600 MG PO TABS: 600.0000 mg | ORAL_TABLET | Freq: Three times a day (TID) | ORAL | 0 refills | Status: DC | PRN
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
## 2017-12-21 NOTE — Discharge Instructions (Addendum)
Ice and elevate as needed for pain and swelling.  Use buddy tape for added support.  Wear wooden shoe or a hard soled shoe to prevent bending of your toes.  Norco and ibuprofen as needed for pain and inflammation.  Follow-up with Dr. Cleda Mccreedy who is on-call for podiatry.  His office is located in [facility].
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
## 2017-12-21 NOTE — ED Notes (Signed)
Pt discharged home after verbalizing understanding of discharge instructions; nad noted.  Pt's mother in law dropped her off and will pick her up. Pt did not drive to ED.

## 2018-05-31 ENCOUNTER — Emergency Department
Admission: EM | Admit: 2018-05-31 | Discharge: 2018-05-31 | Disposition: A | Discharge status: Home / Self Care | Payer: Self-pay | Attending: Emergency Medicine | Admitting: Emergency Medicine

## 2018-05-31 ENCOUNTER — Encounter: Payer: Self-pay | Admitting: Emergency Medicine

## 2018-05-31 ENCOUNTER — Other Ambulatory Visit: Payer: Self-pay

## 2018-05-31 DIAGNOSIS — Y93E8 Activity, other personal hygiene: Secondary | ICD-10-CM | POA: Insufficient documentation

## 2018-05-31 DIAGNOSIS — Y929 Unspecified place or not applicable: Secondary | ICD-10-CM | POA: Insufficient documentation

## 2018-05-31 DIAGNOSIS — F17210 Nicotine dependence, cigarettes, uncomplicated: Secondary | ICD-10-CM | POA: Insufficient documentation

## 2018-05-31 DIAGNOSIS — T162XXA Foreign body in left ear, initial encounter: Secondary | ICD-10-CM | POA: Insufficient documentation

## 2018-05-31 DIAGNOSIS — Z79899 Other long term (current) drug therapy: Secondary | ICD-10-CM | POA: Insufficient documentation

## 2018-05-31 DIAGNOSIS — Y999 Unspecified external cause status: Secondary | ICD-10-CM | POA: Insufficient documentation

## 2018-05-31 DIAGNOSIS — X58XXXA Exposure to other specified factors, initial encounter: Secondary | ICD-10-CM | POA: Insufficient documentation

## 2018-05-31 NOTE — ED Provider Notes (Signed)
[facility] Emergency Department Provider Note  ____________________________________________   First MD Initiated Contact with Patient 05/31/18 0236     (approximate)  I have reviewed the triage vital signs and the nursing notes.   HISTORY  Chief Complaint Foreign Body in Ear   HPI Maureen Underwood is a 43 y.o. female who self presents to the emergency department with pain in her left ear that began acutely at 1 PM roughly 12 or 13 hours prior to arrival.  She was using an off brand Q-tip to clean her ear when the cotton fell off and got stuck in her ear.  Her husband attempted to retrieve it with tweezers but seems to have pushed it deeper inside.  She says she has decreased hearing and aching constant pain in the left ear.  No discharge.    Past Medical History:  Diagnosis Date  . Anxiety   . Degenerative disc disease, lumbar   . Diarrhea   . Dyspepsia   . Fatty liver   . GERD (gastroesophageal reflux disease)   . IBS (irritable bowel syndrome)   . Personality disorder ([place])   . Scoliosis    with associated back pain    Patient Active Problem List   Diagnosis Date Noted  . History of colonic polyps 12/08/2016  . Idiopathic scoliosis 11/04/2015  . Chronic radicular pain of lower back 11/04/2015  . Tobacco abuse 11/04/2015  . Irritable bowel syndrome with diarrhea 03/27/2015  . HLD (hyperlipidemia) 12/06/2014  . HSIL (high grade squamous intraepithelial lesion) on Pap smear of cervix 12/06/2014  . Anxiety 11/27/2014  . Depression 11/27/2014    Past Surgical History:  Procedure Laterality Date  . CERVICAL BIOPSY  W/ LOOP ELECTRODE EXCISION    . CHOLECYSTECTOMY    . DILATION AND CURETTAGE OF UTERUS     2005  . INCISION AND DRAINAGE PERIRECTAL ABSCESS      Prior to Admission medications   Medication Sig Start Date End Date Taking? Authorizing Provider  Cholestyramine POWD 4 g by Does not apply route daily. 12/07/16   Ladell Pier,  MD  colestipol (COLESTID) 1 g tablet Take 1 tablet (1 g total) by mouth 3 (three) times daily as needed. 12/07/16   Ladell Pier, MD  dicyclomine (BENTYL) 10 MG capsule TAKE 1 CAPSULE BY MOUTH EVERY 8 HOURS AS NEEDED FOR SPASMS. 12/07/16   Ladell Pier, MD  DULoxetine (CYMBALTA) 30 MG capsule TAKE 1 CAPSULE BY MOUTH DAILY. 02/13/17   Ladell Pier, MD  gabapentin (NEURONTIN) 100 MG capsule Take 1 capsule (100 mg total) by mouth 2 (two) times daily. 12/07/16   Ladell Pier, MD  HYDROcodone-acetaminophen (NORCO/VICODIN) 5-325 MG tablet Take 1 tablet by mouth every 6 (six) hours as needed for moderate pain. 12/21/17   Johnn Hai, PA-C  ibuprofen (ADVIL,MOTRIN) 600 MG tablet Take 1 tablet (600 mg total) by mouth every 8 (eight) hours as needed. 12/21/17   Johnn Hai, PA-C  Norgestimate-Ethinyl Estradiol Triphasic (ORTHO TRI-CYCLEN LO) 0.18/0.215/0.25 MG-25 MCG tab Take 1 tablet by mouth daily. 12/07/16   Ladell Pier, MD  omeprazole (PRILOSEC) 20 MG capsule TAKE 1 CAPSULE BY MOUTH 2 TIMES DAILY BEFORE A MEAL. 12/07/16   Ladell Pier, MD  ondansetron (ZOFRAN ODT) 8 MG disintegrating tablet Take 1 tablet (8 mg total) by mouth every 8 (eight) hours as needed for nausea or vomiting. 12/07/16   Ladell Pier, MD  OVER THE COUNTER MEDICATION  [provider]  QUEtiapine (SEROQUEL) 100 MG tablet TAKE 1 TABLET BY MOUTH AT BEDTIME. 02/13/17   Ladell Pier, MD    Allergies Penicillins  Family History  Problem Relation Age of Onset  . Diabetes Mother   . Diabetes Maternal Grandmother   . Hyperlipidemia Maternal Grandmother   . Heart disease Maternal Grandfather   . Hypertension Maternal Grandfather   . Lung cancer Maternal Grandfather   . Pancreatic cancer Maternal Grandfather   . Colon cancer Neg Hx   . Esophageal cancer Neg Hx   . Stomach cancer Neg Hx   . Liver disease Neg Hx     Social History Social History   Tobacco Use  .  Smoking status: Current Every Day Smoker    Packs/day: 0.25    Types: Cigarettes    Start date: 01/06/1997  . Smokeless tobacco: Never Used  . Tobacco comment: smoked since 29; form given 03-01-16  Substance Use Topics  . Alcohol use: No    Alcohol/week: 0.0 standard drinks  . Drug use: No    Review of Systems Constitutional: No fever/chills ENT: Positive for ear pain Cardiovascular: Denies chest pain. Respiratory: Denies shortness of breath. Gastrointestinal: No abdominal pain.  No nausea, no vomiting.   Neurological: Negative for headaches   ____________________________________________   PHYSICAL EXAM:  VITAL SIGNS: ED Triage Vitals  Enc Vitals Group     BP 05/31/18 0038 131/79     Pulse Rate 05/31/18 0038 75     Resp 05/31/18 0038 17     Temp 05/31/18 0038 98.3 F (36.8 C)     Temp Source 05/31/18 0038 Oral     SpO2 05/31/18 0038 96 %     Weight 05/31/18 0004 202 lb (91.6 kg)     Height 05/31/18 0004 5\' 6"  (1.676 m)     Head Circumference --      Peak Flow --      Pain Score 05/31/18 0004 7     Pain Loc --      Pain Edu? --      Excl. in [place]? --     Constitutional: Alert and oriented x4 appears quite uncomfortable nontoxic no diaphoresis speaks in full clear sentences Head: Unable to visualize the left tympanic membrane.  Just at the very bottom of the tympanic membrane pushed against it is a small amount of cotton. Mouth/Throat: No trismus Neck: No stridor.   Respiratory: Normal respiratory effort.  No retractions. Neurologic:  Normal speech and language. No gross focal neurologic deficits are appreciated.  Skin:  Skin is warm, dry and intact. No rash noted.    ____________________________________________  LABS (all labs ordered are listed, but only abnormal results are displayed)  Labs Reviewed - No data to  display   __________________________________________  EKG   ____________________________________________  RADIOLOGY   ____________________________________________   DIFFERENTIAL includes but not limited to  Tympanic membrane perforation, foreign body in the ear   PROCEDURES  Procedure(s) performed: no  Procedures  Critical Care performed: no  ____________________________________________   INITIAL IMPRESSION / ASSESSMENT AND PLAN / ED COURSE  Pertinent labs & imaging results that were available during my care of the patient were reviewed by me and considered in my medical decision making (see chart for details).   As part of my medical decision making, I reviewed the following data within the [place] History obtained from family if available, nursing notes, old chart and ekg, as well as notes from prior  ED visits.  Explained to the patient that her cotton was all the way in the back of her ear and that I would have difficulty removing it and I did not think I would be able to get it out.  I did offer her outpatient referral to otolaryngology so they could remove it under microscopy.  She said she would prefer me to "try".  I was able to grasp a small amount of the cotton with alligator forceps however I abraded the edge of her canal and caused significant bleeding.  I was subsequently able to visualize the tympanic membrane and it was intact.  I then aborted the procedure at that point saying I would not be able to remove it and gave her referral to otolaryngology.      ____________________________________________   FINAL CLINICAL IMPRESSION(S) / ED DIAGNOSES  Final diagnoses:  Foreign body of left ear, initial encounter      NEW MEDICATIONS STARTED DURING THIS VISIT:  Discharge Medication List as of 05/31/2018  2:49 AM       Note:  This document was prepared using Dragon voice recognition software and may include unintentional dictation  errors.     Darel Hong, MD 06/04/18 [phone]

## 2018-05-31 NOTE — Discharge Instructions (Signed)
Unfortunately today I wasn't able to get your cotton swab out of your ear.  Please follow up with ENT later on today for a recheck and return for any concerns.

## 2018-05-31 NOTE — ED Triage Notes (Signed)
Pt presents to ED with tip of q-tip in left ear since around 1300. Ear becoming increasingly painful. Denies drainage.

## 2018-08-30 ENCOUNTER — Emergency Department
Admission: EM | Admit: 2018-08-30 | Discharge: 2018-08-30 | Disposition: A | Discharge status: Home / Self Care | Payer: HRSA Program | Attending: Emergency Medicine | Admitting: Emergency Medicine

## 2018-08-30 ENCOUNTER — Other Ambulatory Visit: Payer: Self-pay

## 2018-08-30 ENCOUNTER — Emergency Department: Payer: HRSA Program

## 2018-08-30 DIAGNOSIS — F17210 Nicotine dependence, cigarettes, uncomplicated: Secondary | ICD-10-CM | POA: Insufficient documentation

## 2018-08-30 DIAGNOSIS — J069 Acute upper respiratory infection, unspecified: Secondary | ICD-10-CM | POA: Diagnosis not present

## 2018-08-30 DIAGNOSIS — Z7189 Other specified counseling: Secondary | ICD-10-CM

## 2018-08-30 DIAGNOSIS — R509 Fever, unspecified: Secondary | ICD-10-CM | POA: Diagnosis present

## 2018-08-30 DIAGNOSIS — Z03818 Encounter for observation for suspected exposure to other biological agents ruled out: Secondary | ICD-10-CM | POA: Diagnosis not present

## 2018-08-30 DIAGNOSIS — Z79899 Other long term (current) drug therapy: Secondary | ICD-10-CM | POA: Insufficient documentation

## 2018-08-30 DIAGNOSIS — Z20828 Contact with and (suspected) exposure to other viral communicable diseases: Secondary | ICD-10-CM | POA: Diagnosis not present

## 2018-08-30 LAB — COMPREHENSIVE METABOLIC PANEL
ALT: 21 U/L (ref 0–44)
AST: 19 U/L (ref 15–41)
Albumin: 4 g/dL (ref 3.5–5.0)
Alkaline Phosphatase: 75 U/L (ref 38–126)
Anion gap: 5 (ref 5–15)
BUN: 10 mg/dL (ref 6–20)
CO2: 25 mmol/L (ref 22–32)
Calcium: 9.1 mg/dL (ref 8.9–10.3)
Chloride: 108 mmol/L (ref 98–111)
Creatinine, Ser: 0.92 mg/dL (ref 0.44–1.00)
GFR calc Af Amer: >60 mL/min (ref >60)
GFR calc non Af Amer: >60 mL/min (ref >60)
Glucose, Bld: 103 mg/dL — ABNORMAL HIGH (ref 70–99)
Potassium: 3.7 mmol/L (ref 3.5–5.1)
Sodium: 138 mmol/L (ref 135–145)
Total Bilirubin: 0.7 mg/dL (ref 0.3–1.2)
Total Protein: 7.3 g/dL (ref 6.5–8.1)

## 2018-08-30 LAB — CBC WITH DIFFERENTIAL/PLATELET
Abs Immature Granulocytes: 0.02 10*3/uL (ref 0.00–0.07)
Basophils Absolute: 0.1 10*3/uL (ref 0.0–0.1)
Basophils Relative: 1 %
Eosinophils Absolute: 0.3 10*3/uL (ref 0.0–0.5)
Eosinophils Relative: 3 %
HCT: 44.5 % (ref 36.0–46.0)
Hemoglobin: 15.1 g/dL — ABNORMAL HIGH (ref 12.0–15.0)
Immature Granulocytes: 0 %
Lymphocytes Relative: 26 %
Lymphs Abs: 2.7 10*3/uL (ref 0.7–4.0)
MCH: 32.1 pg (ref 26.0–34.0)
MCHC: 33.9 g/dL (ref 30.0–36.0)
MCV: 94.7 fL (ref 80.0–100.0)
Monocytes Absolute: 1 10*3/uL (ref 0.1–1.0)
Monocytes Relative: 9 %
Neutro Abs: 6.5 10*3/uL (ref 1.7–7.7)
Neutrophils Relative %: 61 %
Platelets: 236 10*3/uL (ref 150–400)
RBC: 4.7 MIL/uL (ref 3.87–5.11)
RDW: 12 % (ref 11.5–15.5)
WBC: 10.5 10*3/uL (ref 4.0–10.5)
nRBC: 0 % (ref 0.0–0.2)

## 2018-08-30 LAB — SARS CORONAVIRUS 2 BY RT PCR (HOSPITAL ORDER, PERFORMED IN ~~LOC~~ HOSPITAL LAB): SARS Coronavirus 2: NEGATIVE

## 2018-08-30 LAB — TROPONIN I: Troponin I: <0.03 ng/mL (ref <0.03)

## 2018-08-30 LAB — FIBRIN DERIVATIVES D-DIMER (ARMC ONLY): Fibrin derivatives D-dimer (ARMC): 178.58 ng/mL (FEU) (ref 0.00–499.00)

## 2018-08-30 MED ORDER — ONDANSETRON HCL 4 MG/2ML IJ SOLN
4.0000 mg | Freq: Once | INTRAMUSCULAR | Status: AC
  Administered 2018-08-30: 4 mg via INTRAVENOUS
  Filled 2018-08-30: qty 2

## 2018-08-30 MED ORDER — AZITHROMYCIN 250 MG PO TABS: ORAL_TABLET | ORAL | 0 refills | Status: DC

## 2018-08-30 MED ORDER — PROMETHAZINE HCL 25 MG PO TABS: 25.0000 mg | ORAL_TABLET | Freq: Four times a day (QID) | ORAL | 0 refills | Status: DC | PRN

## 2018-08-30 NOTE — ED Provider Notes (Signed)
[facility] Emergency Department Provider Note  ____________________________________________   First MD Initiated Contact with Patient 08/30/18 1157     (approximate)  I have reviewed the triage vital signs and the nursing notes.   HISTORY  Chief Complaint Fever; Chest Pain; and Cough    HPI Maureen Underwood is a 43 y.o. female presents to the emergency department with URI symptoms.  Is complaining of cough, congestion, fever, chills, chest pain, shortness of breath, and headache, nausea and vomiting yesterday some diarrhea yesterday Denies recent travel, denies close contact with Covid19+ patient, however the patient does work at the [employer].  She states she had a coworker that was out for 14 days and is unsure if she was actually positive for COVID.  She is concerned that she does work with the public.   Past Medical History:  Diagnosis Date   Anxiety    Degenerative disc disease, lumbar    Diarrhea    Dyspepsia    Fatty liver    GERD (gastroesophageal reflux disease)    IBS (irritable bowel syndrome)    Personality disorder ([place])    Scoliosis    with associated back pain    Patient Active Problem List   Diagnosis Date Noted   History of colonic polyps 12/08/2016   Idiopathic scoliosis 11/04/2015   Chronic radicular pain of lower back 11/04/2015   Tobacco abuse 11/04/2015   Irritable bowel syndrome with diarrhea 03/27/2015   HLD (hyperlipidemia) 12/06/2014   HSIL (high grade squamous intraepithelial lesion) on Pap smear of cervix 12/06/2014   Anxiety 11/27/2014   Depression 11/27/2014    Past Surgical History:  Procedure Laterality Date   CERVICAL BIOPSY  W/ LOOP ELECTRODE EXCISION     CHOLECYSTECTOMY     DILATION AND CURETTAGE OF UTERUS     2005   INCISION AND DRAINAGE PERIRECTAL ABSCESS      Prior to Admission medications   Medication Sig Start Date End Date Taking? Authorizing Provider  Cholestyramine POWD  4 g by Does not apply route daily. 12/07/16   Ladell Pier, MD  colestipol (COLESTID) 1 g tablet Take 1 tablet (1 g total) by mouth 3 (three) times daily as needed. 12/07/16   Ladell Pier, MD  dicyclomine (BENTYL) 10 MG capsule TAKE 1 CAPSULE BY MOUTH EVERY 8 HOURS AS NEEDED FOR SPASMS. 12/07/16   Ladell Pier, MD  DULoxetine (CYMBALTA) 30 MG capsule TAKE 1 CAPSULE BY MOUTH DAILY. 02/13/17   Ladell Pier, MD  gabapentin (NEURONTIN) 100 MG capsule Take 1 capsule (100 mg total) by mouth 2 (two) times daily. 12/07/16   Ladell Pier, MD  HYDROcodone-acetaminophen (NORCO/VICODIN) 5-325 MG tablet Take 1 tablet by mouth every 6 (six) hours as needed for moderate pain. 12/21/17   Johnn Hai, PA-C  ibuprofen (ADVIL,MOTRIN) 600 MG tablet Take 1 tablet (600 mg total) by mouth every 8 (eight) hours as needed. 12/21/17   Johnn Hai, PA-C  Norgestimate-Ethinyl Estradiol Triphasic (ORTHO TRI-CYCLEN LO) 0.18/0.215/0.25 MG-25 MCG tab Take 1 tablet by mouth daily. 12/07/16   Ladell Pier, MD  omeprazole (PRILOSEC) 20 MG capsule TAKE 1 CAPSULE BY MOUTH 2 TIMES DAILY BEFORE A MEAL. 12/07/16   Ladell Pier, MD  ondansetron (ZOFRAN ODT) 8 MG disintegrating tablet Take 1 tablet (8 mg total) by mouth every 8 (eight) hours as needed for nausea or vomiting. 12/07/16   Ladell Pier, MD  OVER THE COUNTER MEDICATION  [provider]  QUEtiapine (SEROQUEL) 100 MG tablet TAKE 1 TABLET BY MOUTH AT BEDTIME. 02/13/17   Ladell Pier, MD    Allergies Penicillins  Family History  Problem Relation Age of Onset   Diabetes Mother    Diabetes Maternal Grandmother    Hyperlipidemia Maternal Grandmother    Heart disease Maternal Grandfather    Hypertension Maternal Grandfather    Lung cancer Maternal Grandfather    Pancreatic cancer Maternal Grandfather    Colon cancer Neg Hx    Esophageal cancer Neg Hx    Stomach cancer Neg Hx    Liver disease Neg  Hx     Social History Social History   Tobacco Use   Smoking status: Current Every Day Smoker    Packs/day: 0.25    Types: Cigarettes    Start date: 01/06/1997   Smokeless tobacco: Never Used   Tobacco comment: smoked since 20; form given 03-01-16  Substance Use Topics   Alcohol use: No    Alcohol/week: 0.0 standard drinks   Drug use: No    Review of Systems  Constitutional: Complains of fever/chills, headache Eyes: No visual changes. ENT: Denies sore throat. Respiratory: Complains of cough Gastrointestinal: Positive for nausea vomiting diarrhea Genitourinary: Negative for dysuria. Musculoskeletal: Negative for back pain. Skin: Negative for rash.    ____________________________________________   PHYSICAL EXAM:  VITAL SIGNS: ED Triage Vitals  Enc Vitals Group     BP 08/30/18 1148 (!) 147/80     Pulse Rate 08/30/18 1148 70     Resp 08/30/18 1148 18     Temp 08/30/18 1148 98 F (36.7 C)     Temp Source 08/30/18 1148 Oral     SpO2 08/30/18 1148 97 %     Weight 08/30/18 1146 212 lb (96.2 kg)     Height 08/30/18 1146 5\' 6"  (1.676 m)     Head Circumference --      Peak Flow --      Pain Score 08/30/18 1145 7     Pain Loc --      Pain Edu? --      Excl. in [place]? --     Constitutional: Alert and oriented. Well appearing and in no acute distress. Eyes: Conjunctivae are normal.  Head: Atraumatic. Nose: No congestion/rhinnorhea. Mouth/Throat: Mucous membranes are moist.  Throat appears normal Neck:  supple no lymphadenopathy noted Cardiovascular: Normal rate, regular rhythm. Heart sounds are normal Respiratory: Normal respiratory effort.  No retractions, lungs clear to all station GU: deferred Musculoskeletal: FROM all extremities, warm and well perfused Neurologic:  Normal speech and language.  Annual nerves II through XII grossly intact Skin:  Skin is warm, dry and intact. No rash noted. Psychiatric: Mood and affect are normal. Speech and behavior are  normal.  ____________________________________________   LABS (all labs ordered are listed, but only abnormal results are displayed)  Labs Reviewed  COMPREHENSIVE METABOLIC PANEL - Abnormal; Notable for the following components:      Result Value   Glucose, Bld 103 (*)    All other components within normal limits  CBC WITH DIFFERENTIAL/PLATELET - Abnormal; Notable for the following components:   Hemoglobin 15.1 (*)    All other components within normal limits  SARS CORONAVIRUS 2 (HOSPITAL ORDER, [place] [facility])  TROPONIN I  FIBRIN DERIVATIVES D-DIMER ([facility] ONLY)   ____________________________________________   ____________________________________________  RADIOLOGY  Chest x-ray does not show any edema or consolidation, appears normal  ____________________________________________   PROCEDURES  Procedure(s) performed: No  Procedures    ____________________________________________   INITIAL IMPRESSION / ASSESSMENT AND PLAN / ED COURSE  Pertinent labs & imaging results that were available during my care of the patient were reviewed by me and considered in my medical decision making (see chart for details).   Patient is a 43 year old female who complains of URI symptoms.  She has concerns of covid as she works in public and has had chest pain or shortness of breath.  Physical exam appears to be normal.  Coronavirus test is negative, d-dimer is normal, comprehensive metabolic panel is normal, troponins normal, CBC is normal  Explained all the test results to the patient.  Explained her she does not have coronavirus.  Therefore she was empirically treated for an acute upper respiratory infection.  She was given a prescription for Z-Pak and Phenergan for her nausea/vomiting.  She is to follow-up with her regular doctor if not better in 3 days.  Return emergency department worsening.  She states she understands will comply.  She was discharged in  stable condition and given a work note as requested.   Maureen Underwood was evaluated in Emergency Department on 08/30/2018 for the symptoms described in the history of present illness. She was evaluated in the context of the global COVID-19 pandemic, which necessitated consideration that the patient might be at risk for infection with the SARS-CoV-2 virus that causes COVID-19. Institutional protocols and algorithms that pertain to the evaluation of patients at risk for COVID-19 are in a state of rapid change based on information released by regulatory bodies including the CDC and federal and state organizations. These policies and algorithms were followed during the patient's care in the ED.   As part of my medical decision making, I reviewed the following data within the [place] notes reviewed and incorporated, Labs reviewed labs are normal including covid test being negative, Old chart reviewed, Radiograph reviewed chest x-ray is normal, Notes from prior ED visits and [place] Controlled Substance Database  ____________________________________________   FINAL CLINICAL IMPRESSION(S) / ED DIAGNOSES  Final diagnoses:  Acute URI  Educated About Covid-19 Virus Infection      NEW MEDICATIONS STARTED DURING THIS VISIT:  New Prescriptions   No medications on file     Note:  This document was prepared using Dragon voice recognition software and may include unintentional dictation errors.     Versie Starks, PA-C 08/30/18 1634    Earleen Newport, MD 08/31/18 [phone]

## 2018-08-30 NOTE — ED Notes (Signed)
Pt presents to ED via POV with c/o fever, chest pain, and cough. Pt c/o dry cough at this time. Pt c/o substernal chest pain at this time, worse with coughing. Pt is alert and oriented, NAD noted, able to speak in complete sentences at this time.

## 2018-08-30 NOTE — ED Triage Notes (Signed)
Chest pain worsening with inspiration, cough and subjective fever X 4 days. Pt alert and oriented X4, active, cooperative, pt in NAD. RR even and unlabored, color WNL.  Wearing mask.

## 2018-08-30 NOTE — Discharge Instructions (Addendum)
Follow up with your regular doctor if not better in 3 days, return to the ER if worsening.  Take your medications as prescribed, you do not have Covid 19

## 2018-08-30 NOTE — ED Notes (Signed)
Maureen Kraft, RN aware of pt placement into room. Pt placed on cardiac monitor and into gown. EKG performed prior to being placed in room. Pt alert and oriented X4, active, cooperative, pt in NAD. RR even and unlabored, color WNL.

## 2018-08-30 NOTE — ED Notes (Signed)
After being swabbed for Covid pt c/o nausea and began vomiting/dry heaving. VORB from [place], [place] for 4mg  IV zofran. Administered per PA order.

## 2019-01-10 ENCOUNTER — Telehealth: Payer: Self-pay | Admitting: *Deleted

## 2019-01-10 NOTE — Telephone Encounter (Signed)
Voicemail states to give a call back to [place] with [facility] at [phone]. Voicemail states to give a call back to [place] with [facility] at [phone]. !!!Please schedule a Nurse or CPP appointment for vaccines!!!

## 2019-05-16 ENCOUNTER — Emergency Department (HOSPITAL_COMMUNITY)
Admission: EM | Admit: 2019-05-16 | Discharge: 2019-05-16 | Disposition: A | Discharge status: Home / Self Care | Payer: Self-pay | Attending: Emergency Medicine | Admitting: Emergency Medicine

## 2019-05-16 ENCOUNTER — Other Ambulatory Visit: Payer: Self-pay

## 2019-05-16 DIAGNOSIS — F17210 Nicotine dependence, cigarettes, uncomplicated: Secondary | ICD-10-CM | POA: Insufficient documentation

## 2019-05-16 DIAGNOSIS — Y92481 Parking lot as the place of occurrence of the external cause: Secondary | ICD-10-CM | POA: Insufficient documentation

## 2019-05-16 DIAGNOSIS — Z79899 Other long term (current) drug therapy: Secondary | ICD-10-CM | POA: Insufficient documentation

## 2019-05-16 DIAGNOSIS — T148XXA Other injury of unspecified body region, initial encounter: Secondary | ICD-10-CM | POA: Insufficient documentation

## 2019-05-16 DIAGNOSIS — X509XXA Other and unspecified overexertion or strenuous movements or postures, initial encounter: Secondary | ICD-10-CM | POA: Insufficient documentation

## 2019-05-16 DIAGNOSIS — Y9389 Activity, other specified: Secondary | ICD-10-CM | POA: Insufficient documentation

## 2019-05-16 DIAGNOSIS — Y990 Civilian activity done for income or pay: Secondary | ICD-10-CM | POA: Insufficient documentation

## 2019-05-16 MED ORDER — IBUPROFEN 600 MG PO TABS: 600.0000 mg | ORAL_TABLET | Freq: Four times a day (QID) | ORAL | 0 refills | Status: DC | PRN

## 2019-05-16 MED ORDER — CYCLOBENZAPRINE HCL 10 MG PO TABS: 10.0000 mg | ORAL_TABLET | Freq: Two times a day (BID) | ORAL | 0 refills | Status: DC | PRN
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
## 2019-06-13 ENCOUNTER — Emergency Department: Payer: Self-pay

## 2019-06-13 ENCOUNTER — Encounter: Payer: Self-pay | Admitting: Intensive Care

## 2019-06-13 ENCOUNTER — Emergency Department
Admission: EM | Admit: 2019-06-13 | Discharge: 2019-06-13 | Disposition: A | Discharge status: Home / Self Care | Payer: Self-pay | Attending: Emergency Medicine | Admitting: Emergency Medicine

## 2019-06-13 ENCOUNTER — Other Ambulatory Visit: Payer: Self-pay

## 2019-06-13 DIAGNOSIS — R102 Pelvic and perineal pain: Secondary | ICD-10-CM

## 2019-06-13 DIAGNOSIS — N3090 Cystitis, unspecified without hematuria: Secondary | ICD-10-CM | POA: Insufficient documentation

## 2019-06-13 DIAGNOSIS — R109 Unspecified abdominal pain: Secondary | ICD-10-CM

## 2019-06-13 DIAGNOSIS — F17210 Nicotine dependence, cigarettes, uncomplicated: Secondary | ICD-10-CM | POA: Insufficient documentation

## 2019-06-13 DIAGNOSIS — K529 Noninfective gastroenteritis and colitis, unspecified: Secondary | ICD-10-CM | POA: Insufficient documentation

## 2019-06-13 HISTORY — DX: Polycystic ovarian syndrome: E28.2

## 2019-06-13 LAB — COMPREHENSIVE METABOLIC PANEL
ALT: 28 U/L (ref 0–44)
AST: 18 U/L (ref 15–41)
Albumin: 3.7 g/dL (ref 3.5–5.0)
Alkaline Phosphatase: 59 U/L (ref 38–126)
Anion gap: 12 (ref 5–15)
BUN: 15 mg/dL (ref 6–20)
CO2: 24 mmol/L (ref 22–32)
Calcium: 9.1 mg/dL (ref 8.9–10.3)
Chloride: 102 mmol/L (ref 98–111)
Creatinine, Ser: 1.03 mg/dL — ABNORMAL HIGH (ref 0.44–1.00)
GFR calc Af Amer: >60 mL/min (ref >60)
GFR calc non Af Amer: >60 mL/min (ref >60)
Glucose, Bld: 100 mg/dL — ABNORMAL HIGH (ref 70–99)
Potassium: 3.2 mmol/L — ABNORMAL LOW (ref 3.5–5.1)
Sodium: 138 mmol/L (ref 135–145)
Total Bilirubin: 0.9 mg/dL (ref 0.3–1.2)
Total Protein: 7.8 g/dL (ref 6.5–8.1)

## 2019-06-13 LAB — CBC
HCT: 42.7 % (ref 36.0–46.0)
Hemoglobin: 15 g/dL (ref 12.0–15.0)
MCH: 31.6 pg (ref 26.0–34.0)
MCHC: 35.1 g/dL (ref 30.0–36.0)
MCV: 89.9 fL (ref 80.0–100.0)
Platelets: 296 10*3/uL (ref 150–400)
RBC: 4.75 MIL/uL (ref 3.87–5.11)
RDW: 11.6 % (ref 11.5–15.5)
WBC: 9.1 10*3/uL (ref 4.0–10.5)
nRBC: 0 % (ref 0.0–0.2)

## 2019-06-13 LAB — URINALYSIS, COMPLETE (UACMP) WITH MICROSCOPIC
Bacteria, UA: NONE SEEN
Bilirubin Urine: NEGATIVE
Glucose, UA: NEGATIVE mg/dL
Ketones, ur: NEGATIVE mg/dL
Nitrite: NEGATIVE
Protein, ur: 30 mg/dL — AB
Specific Gravity, Urine: 1.025 (ref 1.005–1.030)
WBC, UA: >50 WBC/hpf — ABNORMAL HIGH (ref 0–5)
pH: 5 (ref 5.0–8.0)

## 2019-06-13 LAB — LIPASE, BLOOD: Lipase: 20 U/L (ref 11–51)

## 2019-06-13 LAB — POCT PREGNANCY, URINE: Preg Test, Ur: NEGATIVE

## 2019-06-13 LAB — CHLAMYDIA/NGC RT PCR (ARMC ONLY)
Chlamydia Tr: NOT DETECTED
N gonorrhoeae: DETECTED — AB

## 2019-06-13 MED ORDER — SODIUM CHLORIDE 0.9 % IV SOLN
1.0000 g | Freq: Once | INTRAVENOUS | Status: AC
  Administered 2019-06-13: 1 g via INTRAVENOUS
  Filled 2019-06-13: qty 10

## 2019-06-13 MED ORDER — CIPROFLOXACIN HCL 500 MG PO TABS: 500.0000 mg | ORAL_TABLET | Freq: Two times a day (BID) | ORAL | 0 refills | Status: AC

## 2019-06-13 MED ORDER — SODIUM CHLORIDE 0.9 % IV SOLN: Freq: Once | INTRAVENOUS | Status: AC

## 2019-06-13 MED ORDER — OXYCODONE-ACETAMINOPHEN 5-325 MG PO TABS
2.0000 | ORAL_TABLET | Freq: Once | ORAL | Status: AC
  Administered 2019-06-13: 2 via ORAL
  Filled 2019-06-13: qty 2

## 2019-06-13 MED ORDER — METRONIDAZOLE 500 MG PO TABS: 500.0000 mg | ORAL_TABLET | Freq: Three times a day (TID) | ORAL | 0 refills | Status: AC

## 2019-06-13 MED ORDER — OXYCODONE-ACETAMINOPHEN 5-325 MG PO TABS: 2.0000 | ORAL_TABLET | Freq: Four times a day (QID) | ORAL | 0 refills | Status: DC | PRN

## 2019-06-13 MED ORDER — MORPHINE SULFATE (PF) 4 MG/ML IV SOLN
4.0000 mg | Freq: Once | INTRAVENOUS | Status: AC
  Administered 2019-06-13: 4 mg via INTRAVENOUS
  Filled 2019-06-13: qty 1

## 2019-06-13 MED ORDER — ONDANSETRON HCL 4 MG/2ML IJ SOLN
4.0000 mg | Freq: Once | INTRAMUSCULAR | Status: AC
  Administered 2019-06-13: 4 mg via INTRAVENOUS
  Filled 2019-06-13: qty 2

## 2019-06-13 NOTE — ED Triage Notes (Signed)
Abdominal cramping started Saturday 06/10/19 and on Sunday 06/11/19 diarrhea and N/V started.

## 2019-06-13 NOTE — ED Notes (Signed)
Taken to US.

## 2019-06-13 NOTE — ED Provider Notes (Signed)
[facility] Emergency Department Provider Note       Time seen: ----------------------------------------- 1:41 PM on 06/13/2019 -----------------------------------------   I have reviewed the triage vital signs and the nursing notes.  HISTORY  Chief Complaint Abdominal Pain and Diarrhea    HPI Maureen Underwood is a 44 y.o. female with a history of anxiety, diarrhea, GERD, IBS, personality disorder, polycystic ovaries who presents to the ED for abdominal crampiness started Saturday the 31st and on Sunday she had diarrhea with nausea vomiting.  Discomfort is 9 out of 10.  Past Medical History:  Diagnosis Date  . Anxiety   . Degenerative disc disease, lumbar   . Diarrhea   . Dyspepsia   . Fatty liver   . GERD (gastroesophageal reflux disease)   . IBS (irritable bowel syndrome)   . Personality disorder (HCC)   . Polycystic ovarian disease   . Scoliosis    with associated back pain    Patient Active Problem List   Diagnosis Date Noted  . History of colonic polyps 12/08/2016  . Idiopathic scoliosis 11/04/2015  . Chronic radicular pain of lower back 11/04/2015  . Tobacco abuse 11/04/2015  . Irritable bowel syndrome with diarrhea 03/27/2015  . HLD (hyperlipidemia) 12/06/2014  . HSIL (high grade squamous intraepithelial lesion) on Pap smear of cervix 12/06/2014  . Anxiety 11/27/2014  . Depression 11/27/2014    Past Surgical History:  Procedure Laterality Date  . CERVICAL BIOPSY  W/ LOOP ELECTRODE EXCISION    . CHOLECYSTECTOMY    . DILATION AND CURETTAGE OF UTERUS     20 05  . INCISION AND DRAINAGE PERIRECTAL ABSCESS      Allergies Penicillins  Social History Social History   Tobacco Use  . Smoking status: Current Every Day Smoker    Packs/day: 0.25    Types: Cigarettes    Start date: 01/06/1997  . Smokeless tobacco: Never Used  . Tobacco comment: smoked since 74; form given 03-01-16  Substance Use Topics  . Alcohol use: No   Alcohol/week: 0.0 standard drinks  . Drug use: No    Review of Systems Constitutional: Negative for fever. Cardiovascular: Negative for chest pain. Respiratory: Negative for shortness of breath. Gastrointestinal: Positive for abdominal pain, nausea vomiting and diarrhea Musculoskeletal: Negative for back pain. Skin: Negative for rash. Neurological: Negative for headaches, focal weakness or numbness.  All systems negative/normal/unremarkable except as stated in the HPI  ____________________________________________   PHYSICAL EXAM:  VITAL SIGNS: ED Triage Vitals  Enc Vitals Group     BP 06/13/19 1157 131/83     Pulse Rate 06/13/19 1157 90     Resp 06/13/19 1157 18     Temp 06/13/19 1157 97.8 F (36.6 C)     Temp Source 06/13/19 1157 Oral     SpO2 06/13/19 1157 97 %     Weight 06/13/19 1158 206 lb (93.4 kg)     Height 06/13/19 1158 5\' 6"  (1.676 m)     Head Circumference --      Peak Flow --      Pain Score 06/13/19 1208 9     Pain Loc --      Pain Edu? --      Excl. in [place]? --     Constitutional: Alert and oriented.  Mild distress from pain Eyes: Conjunctivae are normal. Normal extraocular movements. Cardiovascular: Normal rate, regular rhythm. No murmurs, rubs, or gallops. Respiratory: Normal respiratory effort without tachypnea nor retractions. Breath sounds are clear and equal bilaterally.  No wheezes/rales/rhonchi. Gastrointestinal: Diffuse lower abdominal tenderness, worse in the right lower quadrant, normal bowel sounds. Musculoskeletal: Nontender with normal range of motion in extremities. No lower extremity tenderness nor edema. Neurologic:  Normal speech and language. No gross focal neurologic deficits are appreciated.  Skin:  Skin is warm, dry and intact. No rash noted. Psychiatric: Mood and affect are normal. Speech and behavior are normal.  ____________________________________________  ED COURSE:  As part of my medical decision making, I reviewed the  following data within the [place] History obtained from family if available, nursing notes, old chart and ekg, as well as notes from prior ED visits. Patient presented for abdominal pain, we will assess with labs and imaging as indicated at this time.   Procedures  Maureen Underwood was evaluated in Emergency Department on 06/13/2019 for the symptoms described in the history of present illness. She was evaluated in the context of the global COVID-19 pandemic, which necessitated consideration that the patient might be at risk for infection with the SARS-CoV-2 virus that causes COVID-19. Institutional protocols and algorithms that pertain to the evaluation of patients at risk for COVID-19 are in a state of rapid change based on information released by regulatory bodies including the CDC and federal and state organizations. These policies and algorithms were followed during the patient's care in the ED.  ____________________________________________   LABS (pertinent positives/negatives)  Labs Reviewed  COMPREHENSIVE METABOLIC PANEL - Abnormal; Notable for the following components:      Result Value   Potassium 3.2 (*)    Glucose, Bld 100 (*)    Creatinine, Ser 1.03 (*)    All other components within normal limits  URINALYSIS, COMPLETE (UACMP) WITH MICROSCOPIC - Abnormal; Notable for the following components:   Color, Urine AMBER (*)    APPearance CLOUDY (*)    Hgb urine dipstick MODERATE (*)    Protein, ur 30 (*)    Leukocytes,Ua LARGE (*)    WBC, UA >50 (*)    All other components within normal limits  LIPASE, BLOOD  CBC  POC URINE PREG, ED  POCT PREGNANCY, URINE    RADIOLOGY Images were viewed by me  CT renal protocol  IMPRESSION: 1. No acute abnormality. 2. Tiny 3 mm cystic area within the fundal endometrium, possibly a dilated endometrial gland. Normal endometrial thickness. IMPRESSION: 1. Mild edema/inflammation around the sigmoid colon with probable mild  circumferential wall thickening. Imaging features are most suggestive of an infectious/inflammatory colitis although assessment limited by lack of oral and intravenous contrast material. No evidence for perforation or abscess. While this may be diverticulitis, no inflamed diverticulum is evident and the colon is largely free of diverticular disease. 2. No evidence for urinary stones.  ____________________________________________   DIFFERENTIAL DIAGNOSIS   Gastritis, gastroenteritis, dehydration, electrolyte abnormality, pregnancy, COVID-19  FINAL ASSESSMENT AND PLAN  Abdominal pain, colitis, cystitis   Plan: The patient had presented for abdominal pain with nausea, vomiting and diarrhea. Patient's labs revealed a normal CBC and CMP, but urine was indicative of infection. Patient's imaging revealed an area of sigmoid colitis.  She was unable to provide a stool specimen for culture.  We will place her on Cipro and Flagyl, she received Rocephin here for UTI as well.  She is cleared for outpatient follow-up.   Laurence Aly, MD    Note: This note was generated in part or whole with voice recognition software. Voice recognition is usually quite accurate but there are transcription errors that can  and very often do occur. I apologize for any typographical errors that were not detected and corrected.     Earleen Newport, MD 06/13/19 [phone]

## 2019-06-15 ENCOUNTER — Telehealth: Payer: Self-pay | Admitting: Emergency Medicine

## 2019-06-15 NOTE — Telephone Encounter (Signed)
Called patient to inform of std results.  Was treated during visit.  Left message.

## 2019-06-19 NOTE — Telephone Encounter (Signed)
I have gotten in touch with patient.  Explained that ceftriaxone she received should treat the gonorrhea, but it would be best to be tested in a couple weeks to be sure it is gone.  Also discussed need for partner treatment.

## 2020-02-10 ENCOUNTER — Emergency Department (HOSPITAL_COMMUNITY)
Admission: EM | Admit: 2020-02-10 | Discharge: 2020-02-11 | Disposition: A | Discharge status: Home / Self Care | Payer: Self-pay | Attending: Emergency Medicine | Admitting: Emergency Medicine

## 2020-02-10 ENCOUNTER — Encounter (HOSPITAL_COMMUNITY): Payer: Self-pay | Admitting: Emergency Medicine

## 2020-02-10 ENCOUNTER — Other Ambulatory Visit: Payer: Self-pay

## 2020-02-10 DIAGNOSIS — L02412 Cutaneous abscess of left axilla: Secondary | ICD-10-CM | POA: Insufficient documentation

## 2020-02-10 DIAGNOSIS — R6883 Chills (without fever): Secondary | ICD-10-CM | POA: Insufficient documentation

## 2020-02-10 DIAGNOSIS — Z5321 Procedure and treatment not carried out due to patient leaving prior to being seen by health care provider: Secondary | ICD-10-CM | POA: Insufficient documentation

## 2020-02-10 DIAGNOSIS — L02411 Cutaneous abscess of right axilla: Secondary | ICD-10-CM | POA: Insufficient documentation

## 2020-02-10 DIAGNOSIS — M533 Sacrococcygeal disorders, not elsewhere classified: Secondary | ICD-10-CM | POA: Insufficient documentation

## 2020-02-10 LAB — CBC WITH DIFFERENTIAL/PLATELET
Abs Immature Granulocytes: 0.05 10*3/uL (ref 0.00–0.07)
Basophils Absolute: 0 10*3/uL (ref 0.0–0.1)
Basophils Relative: 0 %
Eosinophils Absolute: 0.1 10*3/uL (ref 0.0–0.5)
Eosinophils Relative: 1 %
HCT: 40.8 % (ref 36.0–46.0)
Hemoglobin: 13.8 g/dL (ref 12.0–15.0)
Immature Granulocytes: 0 %
Lymphocytes Relative: 13 %
Lymphs Abs: 2 10*3/uL (ref 0.7–4.0)
MCH: 31.7 pg (ref 26.0–34.0)
MCHC: 33.8 g/dL (ref 30.0–36.0)
MCV: 93.8 fL (ref 80.0–100.0)
Monocytes Absolute: 2.1 10*3/uL — ABNORMAL HIGH (ref 0.1–1.0)
Monocytes Relative: 13 %
Neutro Abs: 11.3 10*3/uL — ABNORMAL HIGH (ref 1.7–7.7)
Neutrophils Relative %: 73 %
Platelets: 230 10*3/uL (ref 150–400)
RBC: 4.35 MIL/uL (ref 3.87–5.11)
RDW: 12.1 % (ref 11.5–15.5)
WBC: 15.5 10*3/uL — ABNORMAL HIGH (ref 4.0–10.5)
nRBC: 0 % (ref 0.0–0.2)

## 2020-02-10 LAB — URINALYSIS, ROUTINE W REFLEX MICROSCOPIC
Bilirubin Urine: NEGATIVE
Glucose, UA: NEGATIVE mg/dL
Ketones, ur: NEGATIVE mg/dL
Nitrite: NEGATIVE
Protein, ur: NEGATIVE mg/dL
Specific Gravity, Urine: 1.016 (ref 1.005–1.030)
Squamous Epithelial / HPF: >50 — ABNORMAL HIGH (ref 0–5)
pH: 5 (ref 5.0–8.0)

## 2020-02-10 LAB — COMPREHENSIVE METABOLIC PANEL
ALT: 35 U/L (ref 0–44)
AST: 21 U/L (ref 15–41)
Albumin: 3.8 g/dL (ref 3.5–5.0)
Alkaline Phosphatase: 75 U/L (ref 38–126)
Anion gap: 11 (ref 5–15)
BUN: 11 mg/dL (ref 6–20)
CO2: 24 mmol/L (ref 22–32)
Calcium: 9.3 mg/dL (ref 8.9–10.3)
Chloride: 101 mmol/L (ref 98–111)
Creatinine, Ser: 1.1 mg/dL — ABNORMAL HIGH (ref 0.44–1.00)
GFR calc Af Amer: >60 mL/min (ref >60)
GFR calc non Af Amer: >60 mL/min (ref >60)
Glucose, Bld: 87 mg/dL (ref 70–99)
Potassium: 3.5 mmol/L (ref 3.5–5.1)
Sodium: 136 mmol/L (ref 135–145)
Total Bilirubin: 1.9 mg/dL — ABNORMAL HIGH (ref 0.3–1.2)
Total Protein: 7 g/dL (ref 6.5–8.1)

## 2020-02-10 LAB — I-STAT BETA HCG BLOOD, ED (MC, WL, AP ONLY): I-stat hCG, quantitative: <5 m[IU]/mL (ref <5)

## 2020-02-10 NOTE — ED Triage Notes (Signed)
Pt reports abscess to bilateral axilla x 2 days with chills.  Also reports "tailbone" pain x 4-5 days.  Reports history of same.

## 2020-02-11 ENCOUNTER — Emergency Department
Admission: EM | Admit: 2020-02-11 | Discharge: 2020-02-11 | Disposition: A | Discharge status: Home / Self Care | Payer: Self-pay | Attending: Emergency Medicine | Admitting: Emergency Medicine

## 2020-02-11 ENCOUNTER — Other Ambulatory Visit: Payer: Self-pay

## 2020-02-11 ENCOUNTER — Encounter: Payer: Self-pay | Admitting: Emergency Medicine

## 2020-02-11 DIAGNOSIS — F17210 Nicotine dependence, cigarettes, uncomplicated: Secondary | ICD-10-CM | POA: Insufficient documentation

## 2020-02-11 DIAGNOSIS — L0390 Cellulitis, unspecified: Secondary | ICD-10-CM

## 2020-02-11 DIAGNOSIS — L02411 Cutaneous abscess of right axilla: Secondary | ICD-10-CM | POA: Insufficient documentation

## 2020-02-11 DIAGNOSIS — L0291 Cutaneous abscess, unspecified: Secondary | ICD-10-CM

## 2020-02-11 DIAGNOSIS — L03113 Cellulitis of right upper limb: Secondary | ICD-10-CM | POA: Insufficient documentation

## 2020-02-11 LAB — CBC WITH DIFFERENTIAL/PLATELET
Abs Immature Granulocytes: 0.06 10*3/uL (ref 0.00–0.07)
Basophils Absolute: 0 10*3/uL (ref 0.0–0.1)
Basophils Relative: 0 %
Eosinophils Absolute: 0.1 10*3/uL (ref 0.0–0.5)
Eosinophils Relative: 1 %
HCT: 36.7 % (ref 36.0–46.0)
Hemoglobin: 12.9 g/dL (ref 12.0–15.0)
Immature Granulocytes: 0 %
Lymphocytes Relative: 14 %
Lymphs Abs: 2.3 10*3/uL (ref 0.7–4.0)
MCH: 32.4 pg (ref 26.0–34.0)
MCHC: 35.1 g/dL (ref 30.0–36.0)
MCV: 92.2 fL (ref 80.0–100.0)
Monocytes Absolute: 2 10*3/uL — ABNORMAL HIGH (ref 0.1–1.0)
Monocytes Relative: 13 %
Neutro Abs: 11.3 10*3/uL — ABNORMAL HIGH (ref 1.7–7.7)
Neutrophils Relative %: 72 %
Platelets: 208 10*3/uL (ref 150–400)
RBC: 3.98 MIL/uL (ref 3.87–5.11)
RDW: 12 % (ref 11.5–15.5)
WBC: 15.7 10*3/uL — ABNORMAL HIGH (ref 4.0–10.5)
nRBC: 0 % (ref 0.0–0.2)

## 2020-02-11 LAB — BASIC METABOLIC PANEL
Anion gap: 10 (ref 5–15)
BUN: 9 mg/dL (ref 6–20)
CO2: 24 mmol/L (ref 22–32)
Calcium: 8.6 mg/dL — ABNORMAL LOW (ref 8.9–10.3)
Chloride: 100 mmol/L (ref 98–111)
Creatinine, Ser: 0.86 mg/dL (ref 0.44–1.00)
GFR calc Af Amer: >60 mL/min (ref >60)
GFR calc non Af Amer: >60 mL/min (ref >60)
Glucose, Bld: 102 mg/dL — ABNORMAL HIGH (ref 70–99)
Potassium: 3.3 mmol/L — ABNORMAL LOW (ref 3.5–5.1)
Sodium: 134 mmol/L — ABNORMAL LOW (ref 135–145)

## 2020-02-11 MED ORDER — OXYCODONE-ACETAMINOPHEN 5-325 MG PO TABS
1.0000 | ORAL_TABLET | Freq: Once | ORAL | Status: AC
  Administered 2020-02-11: 1 via ORAL
  Filled 2020-02-11: qty 1

## 2020-02-11 MED ORDER — LIDOCAINE HCL (PF) 1 % IJ SOLN
5.0000 mL | Freq: Once | INTRAMUSCULAR | Status: AC
  Administered 2020-02-11: 5 mL via INTRADERMAL
  Filled 2020-02-11: qty 5

## 2020-02-11 MED ORDER — SODIUM CHLORIDE 0.9 % IV BOLUS
1000.0000 mL | Freq: Once | INTRAVENOUS | Status: AC
  Administered 2020-02-11: 1000 mL via INTRAVENOUS

## 2020-02-11 MED ORDER — CLINDAMYCIN HCL 300 MG PO CAPS: 300.0000 mg | ORAL_CAPSULE | Freq: Four times a day (QID) | ORAL | 0 refills | Status: AC

## 2020-02-11 MED ORDER — CLINDAMYCIN PHOSPHATE 600 MG/50ML IV SOLN
600.0000 mg | Freq: Once | INTRAVENOUS | Status: AC
  Administered 2020-02-11: 600 mg via INTRAVENOUS
  Filled 2020-02-11: qty 50

## 2020-02-11 MED ORDER — SODIUM CHLORIDE 0.9 % IV BOLUS: 500.0000 mL | Freq: Once | INTRAVENOUS | Status: DC

## 2020-02-11 MED ORDER — POTASSIUM CHLORIDE CRYS ER 20 MEQ PO TBCR
40.0000 meq | EXTENDED_RELEASE_TABLET | Freq: Once | ORAL | Status: AC
  Administered 2020-02-11: 40 meq via ORAL
  Filled 2020-02-11: qty 2

## 2020-02-11 MED ORDER — IBUPROFEN 400 MG PO TABS
600.0000 mg | ORAL_TABLET | Freq: Once | ORAL | Status: AC
  Administered 2020-02-11: 600 mg via ORAL
  Filled 2020-02-11: qty 2

## 2020-02-11 NOTE — ED Notes (Signed)
Attempted to call pt husband, per request. Unable to reach him at the number given

## 2020-02-11 NOTE — ED Notes (Signed)
Patients husband came in cussing and yelling stating "my wife been here 10 hours yall need to get this together dont make no sense." patient comes up to this NT and states she is leaving to go to [facility]

## 2020-02-11 NOTE — ED Notes (Signed)
Abscesses under both arms for aobut 3 days.  Left is swollen area, not red.  Right under arm is swollen area with area of redness around it about5x8 inchs and I have marked the edges of redness.  She says she has had fever at home.

## 2020-02-11 NOTE — ED Provider Notes (Signed)
[facility] Emergency Department Provider Note  ____________________________________________  Time seen: Approximately 3:06 PM  I have reviewed the triage vital signs and the nursing notes.   HISTORY  Chief Complaint Abscess    HPI Maureen Underwood is a 44 y.o. female that presents to emergency department for evaluation of right axilla abscess for 3 days. Swelling and erythema have been extending down her arm. She feels warm and is having chills but has not checked her temperature. Patient went to [facility] last night but left after a long wait. She is using heat without relief. She is allergic to penicillins and cephalosporins.  Past Medical History:  Diagnosis Date  . Anxiety   . Degenerative disc disease, lumbar   . Diarrhea   . Dyspepsia   . Fatty liver   . GERD (gastroesophageal reflux disease)   . IBS (irritable bowel syndrome)   . Personality disorder ([place])   . Polycystic ovarian disease   . Scoliosis    with associated back pain    Patient Active Problem List   Diagnosis Date Noted  . History of colonic polyps 12/08/2016  . Idiopathic scoliosis 11/04/2015  . Chronic radicular pain of lower back 11/04/2015  . Tobacco abuse 11/04/2015  . Irritable bowel syndrome with diarrhea 03/27/2015  . HLD (hyperlipidemia) 12/06/2014  . HSIL (high grade squamous intraepithelial lesion) on Pap smear of cervix 12/06/2014  . Anxiety 11/27/2014  . Depression 11/27/2014    Past Surgical History:  Procedure Laterality Date  . CERVICAL BIOPSY  W/ LOOP ELECTRODE EXCISION    . CHOLECYSTECTOMY    . DILATION AND CURETTAGE OF UTERUS     2005  . INCISION AND DRAINAGE PERIRECTAL ABSCESS      Prior to Admission medications   Medication Sig Start Date End Date Taking? Authorizing Provider  azithromycin (ZITHROMAX Z-PAK) 250 MG tablet 2 pills today then 1 pill a day for 4 days 08/30/18   Caryn Section Linden Dolin, PA-C  Cholestyramine POWD 4 g by Does not apply route  daily. 12/07/16   Ladell Pier, MD  clindamycin (CLEOCIN) 300 MG capsule Take 1 capsule (300 mg total) by mouth 4 (four) times daily for 10 days. 02/11/20 02/21/20  Laban Emperor, PA-C  colestipol (COLESTID) 1 g tablet Take 1 tablet (1 g total) by mouth 3 (three) times daily as needed. 12/07/16   Ladell Pier, MD  cyclobenzaprine (FLEXERIL) 10 MG tablet Take 1 tablet (10 mg total) by mouth 2 (two) times daily as needed for muscle spasms. 05/16/19   Domenic Moras, PA-C  dicyclomine (BENTYL) 10 MG capsule TAKE 1 CAPSULE BY MOUTH EVERY 8 HOURS AS NEEDED FOR SPASMS. 12/07/16   Ladell Pier, MD  DULoxetine (CYMBALTA) 30 MG capsule TAKE 1 CAPSULE BY MOUTH DAILY. 02/13/17   Ladell Pier, MD  gabapentin (NEURONTIN) 100 MG capsule Take 1 capsule (100 mg total) by mouth 2 (two) times daily. 12/07/16   Ladell Pier, MD  HYDROcodone-acetaminophen (NORCO/VICODIN) 5-325 MG tablet Take 1 tablet by mouth every 6 (six) hours as needed for moderate pain. 12/21/17   Johnn Hai, PA-C  ibuprofen (ADVIL) 600 MG tablet Take 1 tablet (600 mg total) by mouth every 6 (six) hours as needed. 05/16/19   Domenic Moras, PA-C  Norgestimate-Ethinyl Estradiol Triphasic (ORTHO TRI-CYCLEN LO) 0.18/0.215/0.25 MG-25 MCG tab Take 1 tablet by mouth daily. 12/07/16   Ladell Pier, MD  omeprazole (PRILOSEC) 20 MG capsule TAKE 1 CAPSULE BY MOUTH 2 TIMES  DAILY BEFORE A MEAL. 12/07/16   Ladell Pier, MD  ondansetron (ZOFRAN ODT) 8 MG disintegrating tablet Take 1 tablet (8 mg total) by mouth every 8 (eight) hours as needed for nausea or vomiting. 12/07/16   Ladell Pier, MD  OVER THE COUNTER MEDICATION     [provider]  oxyCODONE-acetaminophen (PERCOCET) 5-325 MG tablet Take 2 tablets by mouth every 6 (six) hours as needed for moderate pain or severe pain. 06/13/19   Earleen Newport, MD  promethazine (PHENERGAN) 25 MG tablet Take 1 tablet (25 mg total) by mouth every 6 (six) hours as needed for  nausea or vomiting. 08/30/18   Caryn Section Linden Dolin, PA-C  QUEtiapine (SEROQUEL) 100 MG tablet TAKE 1 TABLET BY MOUTH AT BEDTIME. 02/13/17   Ladell Pier, MD    Allergies Penicillins  Family History  Problem Relation Age of Onset  . Diabetes Mother   . Diabetes Maternal Grandmother   . Hyperlipidemia Maternal Grandmother   . Heart disease Maternal Grandfather   . Hypertension Maternal Grandfather   . Lung cancer Maternal Grandfather   . Pancreatic cancer Maternal Grandfather   . Colon cancer Neg Hx   . Esophageal cancer Neg Hx   . Stomach cancer Neg Hx   . Liver disease Neg Hx     Social History Social History   Tobacco Use  . Smoking status: Current Every Day Smoker    Packs/day: 0.25    Types: Cigarettes    Start date: 01/06/1997  . Smokeless tobacco: Never Used  . Tobacco comment: smoked since 22; form given 03-01-16  Vaping Use  . Vaping Use: Never used  Substance Use Topics  . Alcohol use: No    Alcohol/week: 0.0 standard drinks  . Drug use: No     Review of Systems  Constitutional: No fever/chills ENT: No upper respiratory complaints. Cardiovascular: No chest pain. Respiratory: No cough. No SOB. Gastrointestinal: No abdominal pain.  No nausea, no vomiting.  Musculoskeletal: Negative for musculoskeletal pain. Skin: Negative for abrasions, lacerations, ecchymosis. Positive for rash. Neurological: Negative for headaches, numbness or tingling   ____________________________________________   PHYSICAL EXAM:  VITAL SIGNS: ED Triage Vitals  Enc Vitals Group     BP 02/11/20 1415 119/73     Pulse Rate 02/11/20 1415 81     Resp 02/11/20 1415 14     Temp 02/11/20 1415 98.9 F (37.2 C)     Temp Source 02/11/20 1415 Oral     SpO2 02/11/20 1415 95 %     Weight 02/11/20 1034 210 lb (95.3 kg)     Height 02/11/20 1034 5\' 6"  (1.676 m)     Head Circumference --      Peak Flow --      Pain Score 02/11/20 1034 10     Pain Loc --      Pain Edu? --      Excl. in  [place]? --      Constitutional: Alert and oriented. Well appearing and in no acute distress. Eyes: Conjunctivae are normal. PERRL. EOMI. Head: Atraumatic. ENT:      Ears:      Nose: No congestion/rhinnorhea.      Mouth/Throat: Mucous membranes are moist.  Neck: No stridor.   Cardiovascular: Normal rate, regular rhythm.  Good peripheral circulation. Respiratory: Normal respiratory effort without tachypnea or retractions. Lungs CTAB. Good air entry to the bases with no decreased or absent breath sounds. Musculoskeletal: Full range of motion to all extremities. No  gross deformities appreciated. Neurologic:  Normal speech and language. No gross focal neurologic deficits are appreciated.  Skin:  Skin is warm, dry. 2 inches by 2 inches of swelling and induration to right proximal arm with 4  Inches of erythema extending down arm. 1cm by 1cm area of induration with central skin defect and active yellow discharge. Psychiatric: Mood and affect are normal. Speech and behavior are normal. Patient exhibits appropriate insight and judgement.   ____________________________________________   LABS (all labs ordered are listed, but only abnormal results are displayed)  Labs Reviewed  CBC WITH DIFFERENTIAL/PLATELET - Abnormal; Notable for the following components:      Result Value   WBC 15.7 (*)    Neutro Abs 11.3 (*)    Monocytes Absolute 2.0 (*)    All other components within normal limits  BASIC METABOLIC PANEL - Abnormal; Notable for the following components:   Sodium 134 (*)    Potassium 3.3 (*)    Glucose, Bld 102 (*)    Calcium 8.6 (*)    All other components within normal limits   ____________________________________________  EKG   ____________________________________________  RADIOLOGY   No results found.  ____________________________________________    PROCEDURES  Procedure(s) performed:    Procedures  INCISION AND DRAINAGE Performed by: Laban Emperor Consent:  Verbal consent obtained. Risks and benefits: risks, benefits and alternatives were discussed Type: abscess  Body area: axilla  Anesthesia: local infiltration  Incision was made with a scalpel.  Local anesthetic: lidocaine 1 % without epinephrine  Anesthetic total: 3 ml  Complexity: complex Blunt dissection to break up loculations  Drainage: purulent  Drainage amount: moderate  Packing material: 1/4 in iodoform gauze  Patient tolerance: Patient tolerated the procedure well with no immediate complications.    Medications  clindamycin (CLEOCIN) IVPB 600 mg (600 mg Intravenous New Bag/Given 02/11/20 1515)  oxyCODONE-acetaminophen (PERCOCET/ROXICET) 5-325 MG per tablet 1 tablet (1 tablet Oral Given 02/11/20 1514)  lidocaine (PF) (XYLOCAINE) 1 % injection 5 mL (5 mLs Intradermal Given 02/11/20 1516)  sodium chloride 0.9 % bolus 1,000 mL (1,000 mLs Intravenous New Bag/Given 02/11/20 1516)  ibuprofen (ADVIL) tablet 600 mg (600 mg Oral Given 02/11/20 1514)     ____________________________________________   INITIAL IMPRESSION / ASSESSMENT AND PLAN / ED COURSE  Pertinent labs & imaging results that were available during my care of the patient were reviewed by me and considered in my medical decision making (see chart for details).  Review of the [place] CSRS was performed in accordance of the [employer] prior to dispensing any controlled drugs.   Patient's diagnosis is consistent with abscess and cellulitis.  Vital signs and exam are reassuring.  Abscess was drained in the emergency department.  Area of cellulitis was drawn with a skin pen.  Patient was instructed to return to the emergency department tomorrow if cellulitis extends outside of the area of skin pen.  Patient will be discharged home with prescriptions for clindamycin.  Patient is to follow up with primary care or emergency department as directed. Patient is given ED precautions to return to the ED for any worsening or new  symptoms.   Maureen Underwood was evaluated in Emergency Department on 02/11/2020 for the symptoms described in the history of present illness. She was evaluated in the context of the global COVID-19 pandemic, which necessitated consideration that the patient might be at risk for infection with the SARS-CoV-2 virus that causes COVID-19. Institutional protocols and algorithms that pertain to the evaluation of patients  at risk for COVID-19 are in a state of rapid change based on information released by regulatory bodies including the CDC and federal and state organizations. These policies and algorithms were followed during the patient's care in the ED.  ____________________________________________  FINAL CLINICAL IMPRESSION(S) / ED DIAGNOSES  Final diagnoses:  Abscess  Cellulitis, unspecified cellulitis site      NEW MEDICATIONS STARTED DURING THIS VISIT:  ED Discharge Orders         Ordered    clindamycin (CLEOCIN) 300 MG capsule  4 times daily        02/11/20 1511              This chart was dictated using voice recognition software/Dragon. Despite best efforts to proofread, errors can occur which can change the meaning. Any change was purely unintentional.    Laban Emperor, PA-C 02/11/20 1542    Lavonia Drafts, MD 02/11/20 [phone]

## 2020-02-11 NOTE — ED Triage Notes (Signed)
Pt reports has an abscess to both of her underarms. Pt reports abscesses have been there for 3 days and has a hx of the same. Pt reports was at [facility] ED last pm but left due to the wait

## 2020-02-12 ENCOUNTER — Other Ambulatory Visit: Payer: Self-pay

## 2020-02-12 DIAGNOSIS — K219 Gastro-esophageal reflux disease without esophagitis: Secondary | ICD-10-CM | POA: Diagnosis present

## 2020-02-12 DIAGNOSIS — R6510 Systemic inflammatory response syndrome (SIRS) of non-infectious origin without acute organ dysfunction: Secondary | ICD-10-CM | POA: Diagnosis present

## 2020-02-12 DIAGNOSIS — Z83438 Family history of other disorder of lipoprotein metabolism and other lipidemia: Secondary | ICD-10-CM

## 2020-02-12 DIAGNOSIS — Z23 Encounter for immunization: Secondary | ICD-10-CM

## 2020-02-12 DIAGNOSIS — E876 Hypokalemia: Secondary | ICD-10-CM | POA: Diagnosis present

## 2020-02-12 DIAGNOSIS — L0501 Pilonidal cyst with abscess: Principal | ICD-10-CM | POA: Diagnosis present

## 2020-02-12 DIAGNOSIS — Z833 Family history of diabetes mellitus: Secondary | ICD-10-CM

## 2020-02-12 DIAGNOSIS — E872 Acidosis: Secondary | ICD-10-CM | POA: Diagnosis present

## 2020-02-12 DIAGNOSIS — Z880 Allergy status to penicillin: Secondary | ICD-10-CM

## 2020-02-12 DIAGNOSIS — Z801 Family history of malignant neoplasm of trachea, bronchus and lung: Secondary | ICD-10-CM

## 2020-02-12 DIAGNOSIS — F32A Depression, unspecified: Secondary | ICD-10-CM | POA: Diagnosis present

## 2020-02-12 DIAGNOSIS — Z800 Family history of malignant neoplasm of digestive organs: Secondary | ICD-10-CM

## 2020-02-12 DIAGNOSIS — Z8249 Family history of ischemic heart disease and other diseases of the circulatory system: Secondary | ICD-10-CM

## 2020-02-12 DIAGNOSIS — K580 Irritable bowel syndrome with diarrhea: Secondary | ICD-10-CM | POA: Diagnosis present

## 2020-02-12 DIAGNOSIS — Z9049 Acquired absence of other specified parts of digestive tract: Secondary | ICD-10-CM

## 2020-02-12 DIAGNOSIS — K760 Fatty (change of) liver, not elsewhere classified: Secondary | ICD-10-CM | POA: Diagnosis present

## 2020-02-12 DIAGNOSIS — Z20822 Contact with and (suspected) exposure to covid-19: Secondary | ICD-10-CM | POA: Diagnosis present

## 2020-02-12 DIAGNOSIS — L02411 Cutaneous abscess of right axilla: Secondary | ICD-10-CM | POA: Diagnosis present

## 2020-02-12 DIAGNOSIS — L03111 Cellulitis of right axilla: Secondary | ICD-10-CM | POA: Diagnosis present

## 2020-02-12 DIAGNOSIS — Z87891 Personal history of nicotine dependence: Secondary | ICD-10-CM

## 2020-02-12 LAB — CBC WITH DIFFERENTIAL/PLATELET
Abs Immature Granulocytes: 0.04 10*3/uL (ref 0.00–0.07)
Basophils Absolute: 0 10*3/uL (ref 0.0–0.1)
Basophils Relative: 0 %
Eosinophils Absolute: 0.2 10*3/uL (ref 0.0–0.5)
Eosinophils Relative: 2 %
HCT: 35.5 % — ABNORMAL LOW (ref 36.0–46.0)
Hemoglobin: 13.2 g/dL (ref 12.0–15.0)
Immature Granulocytes: 0 %
Lymphocytes Relative: 20 %
Lymphs Abs: 2.4 10*3/uL (ref 0.7–4.0)
MCH: 33.3 pg (ref 26.0–34.0)
MCHC: 37.2 g/dL — ABNORMAL HIGH (ref 30.0–36.0)
MCV: 89.6 fL (ref 80.0–100.0)
Monocytes Absolute: 1 10*3/uL (ref 0.1–1.0)
Monocytes Relative: 9 %
Neutro Abs: 8 10*3/uL — ABNORMAL HIGH (ref 1.7–7.7)
Neutrophils Relative %: 69 %
Platelets: 259 10*3/uL (ref 150–400)
RBC: 3.96 MIL/uL (ref 3.87–5.11)
RDW: 12 % (ref 11.5–15.5)
WBC: 11.7 10*3/uL — ABNORMAL HIGH (ref 4.0–10.5)
nRBC: 0 % (ref 0.0–0.2)

## 2020-02-12 LAB — COMPREHENSIVE METABOLIC PANEL
ALT: 26 U/L (ref 0–44)
AST: 20 U/L (ref 15–41)
Albumin: 3.4 g/dL — ABNORMAL LOW (ref 3.5–5.0)
Alkaline Phosphatase: 74 U/L (ref 38–126)
Anion gap: 10 (ref 5–15)
BUN: 11 mg/dL (ref 6–20)
CO2: 21 mmol/L — ABNORMAL LOW (ref 22–32)
Calcium: 8.5 mg/dL — ABNORMAL LOW (ref 8.9–10.3)
Chloride: 107 mmol/L (ref 98–111)
Creatinine, Ser: 0.85 mg/dL (ref 0.44–1.00)
GFR calc non Af Amer: >60 mL/min (ref >60)
Glucose, Bld: 132 mg/dL — ABNORMAL HIGH (ref 70–99)
Potassium: 3 mmol/L — ABNORMAL LOW (ref 3.5–5.1)
Sodium: 138 mmol/L (ref 135–145)
Total Bilirubin: 0.6 mg/dL (ref 0.3–1.2)
Total Protein: 6.9 g/dL (ref 6.5–8.1)

## 2020-02-12 LAB — LACTIC ACID, PLASMA: Lactic Acid, Venous: 2.2 mmol/L (ref 0.5–1.9)

## 2020-02-12 NOTE — ED Notes (Signed)
1st set blood cultures drawn by Jeb EDT.

## 2020-02-12 NOTE — ED Triage Notes (Signed)
Pt arrived via POV with reports of cellulitis to right arm, pt seen yesterday and had I&D, redness has moved from under arm pit down back of right arm passed outline drawn on yesterday.   Pt states she has NOT started the antibiotics prescribed yesterday.

## 2020-02-13 ENCOUNTER — Inpatient Hospital Stay
Admission: EM | Admit: 2020-02-13 | Discharge: 2020-02-15 | DRG: 580 | Disposition: A | Discharge status: Home / Self Care | Payer: Self-pay | Attending: Internal Medicine | Admitting: Internal Medicine

## 2020-02-13 ENCOUNTER — Other Ambulatory Visit: Payer: Self-pay

## 2020-02-13 ENCOUNTER — Inpatient Hospital Stay: Payer: Self-pay | Admitting: Certified Registered Nurse Anesthetist

## 2020-02-13 ENCOUNTER — Encounter: Payer: Self-pay | Admitting: Internal Medicine

## 2020-02-13 ENCOUNTER — Encounter
Admission: EM | Disposition: A | Discharge status: Home / Self Care | Payer: Self-pay | Source: Home / Self Care | Attending: Internal Medicine

## 2020-02-13 DIAGNOSIS — L03111 Cellulitis of right axilla: Secondary | ICD-10-CM

## 2020-02-13 DIAGNOSIS — A419 Sepsis, unspecified organism: Principal | ICD-10-CM

## 2020-02-13 DIAGNOSIS — L03112 Cellulitis of left axilla: Secondary | ICD-10-CM

## 2020-02-13 DIAGNOSIS — F32A Depression, unspecified: Secondary | ICD-10-CM | POA: Diagnosis present

## 2020-02-13 DIAGNOSIS — L02411 Cutaneous abscess of right axilla: Secondary | ICD-10-CM

## 2020-02-13 DIAGNOSIS — K580 Irritable bowel syndrome with diarrhea: Secondary | ICD-10-CM | POA: Diagnosis present

## 2020-02-13 HISTORY — PX: INCISION AND DRAINAGE ABSCESS: SHX5864

## 2020-02-13 LAB — URINALYSIS, COMPLETE (UACMP) WITH MICROSCOPIC
Bilirubin Urine: NEGATIVE
Glucose, UA: NEGATIVE mg/dL
Ketones, ur: NEGATIVE mg/dL
Leukocytes,Ua: NEGATIVE
Nitrite: NEGATIVE
Protein, ur: NEGATIVE mg/dL
Specific Gravity, Urine: 1.001 — ABNORMAL LOW (ref 1.005–1.030)
pH: 6 (ref 5.0–8.0)

## 2020-02-13 LAB — BASIC METABOLIC PANEL
Anion gap: 7 (ref 5–15)
BUN: 7 mg/dL (ref 6–20)
CO2: 25 mmol/L (ref 22–32)
Calcium: 8.3 mg/dL — ABNORMAL LOW (ref 8.9–10.3)
Chloride: 109 mmol/L (ref 98–111)
Creatinine, Ser: 0.79 mg/dL (ref 0.44–1.00)
GFR calc non Af Amer: >60 mL/min (ref >60)
Glucose, Bld: 100 mg/dL — ABNORMAL HIGH (ref 70–99)
Potassium: 3.6 mmol/L (ref 3.5–5.1)
Sodium: 141 mmol/L (ref 135–145)

## 2020-02-13 LAB — HIV ANTIBODY (ROUTINE TESTING W REFLEX): HIV Screen 4th Generation wRfx: NONREACTIVE

## 2020-02-13 LAB — POCT PREGNANCY, URINE: Preg Test, Ur: NEGATIVE

## 2020-02-13 LAB — PROTIME-INR
INR: 1.1 (ref 0.8–1.2)
Prothrombin Time: 13.6 seconds (ref 11.4–15.2)

## 2020-02-13 LAB — RESPIRATORY PANEL BY RT PCR (FLU A&B, COVID)
Influenza A by PCR: NEGATIVE
Influenza B by PCR: NEGATIVE
SARS Coronavirus 2 by RT PCR: NEGATIVE

## 2020-02-13 LAB — LACTIC ACID, PLASMA: Lactic Acid, Venous: 1.5 mmol/L (ref 0.5–1.9)

## 2020-02-13 LAB — CORTISOL-AM, BLOOD: Cortisol - AM: 7.2 ug/dL (ref 6.7–22.6)

## 2020-02-13 LAB — PROCALCITONIN: Procalcitonin: <0.1 ng/mL

## 2020-02-13 SURGERY — INCISION AND DRAINAGE, ABSCESS
Anesthesia: General | Site: Axilla | Laterality: Right

## 2020-02-13 MED ORDER — MORPHINE SULFATE (PF) 2 MG/ML IV SOLN
2.0000 mg | INTRAVENOUS | Status: DC | PRN
  Administered 2020-02-13: 2 mg via INTRAVENOUS
  Filled 2020-02-13: qty 1

## 2020-02-13 MED ORDER — PHENYLEPHRINE HCL (PRESSORS) 10 MG/ML IV SOLN
INTRAVENOUS | Status: DC | PRN
  Administered 2020-02-13: 150 ug via INTRAVENOUS
  Administered 2020-02-13: 100 ug via INTRAVENOUS
  Administered 2020-02-13: 150 ug via INTRAVENOUS
  Administered 2020-02-13: 100 ug via INTRAVENOUS

## 2020-02-13 MED ORDER — INFLUENZA VAC SPLIT QUAD 0.5 ML IM SUSY
0.5000 mL | PREFILLED_SYRINGE | INTRAMUSCULAR | Status: AC
  Administered 2020-02-14: 0.5 mL via INTRAMUSCULAR
  Filled 2020-02-13: qty 0.5

## 2020-02-13 MED ORDER — ROCURONIUM BROMIDE 100 MG/10ML IV SOLN
INTRAVENOUS | Status: DC | PRN
  Administered 2020-02-13: 50 mg via INTRAVENOUS
  Administered 2020-02-13: 20 mg via INTRAVENOUS

## 2020-02-13 MED ORDER — ALBUMIN HUMAN 25 % IV SOLN
25.0000 g | Freq: Once | INTRAVENOUS | Status: AC
  Administered 2020-02-13: 25 g via INTRAVENOUS
  Filled 2020-02-13: qty 100

## 2020-02-13 MED ORDER — KETOROLAC TROMETHAMINE 30 MG/ML IJ SOLN
15.0000 mg | Freq: Once | INTRAMUSCULAR | Status: AC
  Administered 2020-02-13: 15 mg via INTRAVENOUS
  Filled 2020-02-13: qty 1

## 2020-02-13 MED ORDER — SODIUM CHLORIDE 0.9 % IV SOLN
2.0000 g | Freq: Once | INTRAVENOUS | Status: AC
  Administered 2020-02-13: 2 g via INTRAVENOUS
  Filled 2020-02-13: qty 20

## 2020-02-13 MED ORDER — MORPHINE SULFATE (PF) 4 MG/ML IV SOLN
4.0000 mg | Freq: Once | INTRAVENOUS | Status: AC
  Administered 2020-02-13: 4 mg via INTRAVENOUS
  Filled 2020-02-13: qty 1

## 2020-02-13 MED ORDER — PNEUMOCOCCAL VAC POLYVALENT 25 MCG/0.5ML IJ INJ
0.5000 mL | INJECTION | INTRAMUSCULAR | Status: AC
  Administered 2020-02-14: 0.5 mL via INTRAMUSCULAR
  Filled 2020-02-13: qty 0.5

## 2020-02-13 MED ORDER — LACTATED RINGERS IV BOLUS (SEPSIS)
1000.0000 mL | Freq: Once | INTRAVENOUS | Status: AC
  Administered 2020-02-13: 1000 mL via INTRAVENOUS

## 2020-02-13 MED ORDER — KETOROLAC TROMETHAMINE 30 MG/ML IJ SOLN: 30.0000 mg | Freq: Four times a day (QID) | INTRAMUSCULAR | Status: DC | PRN

## 2020-02-13 MED ORDER — LACTATED RINGERS IV SOLN: INTRAVENOUS | Status: AC

## 2020-02-13 MED ORDER — ROCURONIUM BROMIDE 10 MG/ML (PF) SYRINGE
PREFILLED_SYRINGE | INTRAVENOUS | Status: AC
  Filled 2020-02-13: qty 10

## 2020-02-13 MED ORDER — VANCOMYCIN HCL 2000 MG/400ML IV SOLN
2000.0000 mg | Freq: Once | INTRAVENOUS | Status: AC
  Administered 2020-02-13: 2000 mg via INTRAVENOUS
  Filled 2020-02-13: qty 400

## 2020-02-13 MED ORDER — SODIUM CHLORIDE 0.9 % IV SOLN: INTRAVENOUS | Status: DC

## 2020-02-13 MED ORDER — OXYCODONE HCL 5 MG PO TABS
5.0000 mg | ORAL_TABLET | ORAL | Status: DC | PRN
  Administered 2020-02-13 – 2020-02-15 (×5): 5 mg via ORAL
  Filled 2020-02-13 (×6): qty 1

## 2020-02-13 MED ORDER — PROPOFOL 10 MG/ML IV BOLUS
INTRAVENOUS | Status: DC | PRN
  Administered 2020-02-13: 180 mg via INTRAVENOUS

## 2020-02-13 MED ORDER — LIDOCAINE HCL (CARDIAC) PF 100 MG/5ML IV SOSY
PREFILLED_SYRINGE | INTRAVENOUS | Status: DC | PRN
  Administered 2020-02-13: 100 mg via INTRAVENOUS

## 2020-02-13 MED ORDER — VANCOMYCIN HCL IN DEXTROSE 1-5 GM/200ML-% IV SOLN: 1000.0000 mg | Freq: Once | INTRAVENOUS | Status: DC

## 2020-02-13 MED ORDER — SUGAMMADEX SODIUM 200 MG/2ML IV SOLN
INTRAVENOUS | Status: DC | PRN
  Administered 2020-02-13: 200 mg via INTRAVENOUS

## 2020-02-13 MED ORDER — ACETAMINOPHEN 650 MG RE SUPP: 650.0000 mg | Freq: Four times a day (QID) | RECTAL | Status: DC | PRN

## 2020-02-13 MED ORDER — LIDOCAINE HCL (PF) 2 % IJ SOLN
INTRAMUSCULAR | Status: AC
  Filled 2020-02-13: qty 5

## 2020-02-13 MED ORDER — OXYCODONE HCL 5 MG PO TABS
ORAL_TABLET | ORAL | Status: AC
  Administered 2020-02-13: 5 mg
  Filled 2020-02-13: qty 1

## 2020-02-13 MED ORDER — ALBUMIN HUMAN 25 % IV SOLN
25.0000 g | Freq: Once | INTRAVENOUS | Status: DC
  Filled 2020-02-13: qty 100

## 2020-02-13 MED ORDER — VANCOMYCIN HCL IN DEXTROSE 1-5 GM/200ML-% IV SOLN
1000.0000 mg | Freq: Three times a day (TID) | INTRAVENOUS | Status: DC
  Administered 2020-02-13 – 2020-02-14 (×4): 1000 mg via INTRAVENOUS
  Filled 2020-02-13 (×5): qty 200

## 2020-02-13 MED ORDER — ONDANSETRON HCL 4 MG/2ML IJ SOLN: 4.0000 mg | Freq: Four times a day (QID) | INTRAMUSCULAR | Status: DC | PRN

## 2020-02-13 MED ORDER — FENTANYL CITRATE (PF) 100 MCG/2ML IJ SOLN: 25.0000 ug | INTRAMUSCULAR | Status: DC | PRN

## 2020-02-13 MED ORDER — GLYCOPYRROLATE 0.2 MG/ML IJ SOLN
INTRAMUSCULAR | Status: DC | PRN
  Administered 2020-02-13: .2 mg via INTRAVENOUS

## 2020-02-13 MED ORDER — PROPOFOL 10 MG/ML IV BOLUS
INTRAVENOUS | Status: AC
  Filled 2020-02-13: qty 20

## 2020-02-13 MED ORDER — OXYCODONE HCL 5 MG/5ML PO SOLN: 5.0000 mg | Freq: Once | ORAL | Status: AC | PRN

## 2020-02-13 MED ORDER — PROMETHAZINE HCL 25 MG/ML IJ SOLN: 6.2500 mg | INTRAMUSCULAR | Status: DC | PRN

## 2020-02-13 MED ORDER — MEPERIDINE HCL 50 MG/ML IJ SOLN: 6.2500 mg | INTRAMUSCULAR | Status: DC | PRN

## 2020-02-13 MED ORDER — DEXAMETHASONE SODIUM PHOSPHATE 10 MG/ML IJ SOLN
INTRAMUSCULAR | Status: DC | PRN
  Administered 2020-02-13: 6 mg via INTRAVENOUS

## 2020-02-13 MED ORDER — MIDAZOLAM HCL 2 MG/2ML IJ SOLN
INTRAMUSCULAR | Status: AC
  Filled 2020-02-13: qty 2

## 2020-02-13 MED ORDER — FENTANYL CITRATE (PF) 250 MCG/5ML IJ SOLN
INTRAMUSCULAR | Status: AC
  Filled 2020-02-13: qty 5

## 2020-02-13 MED ORDER — ONDANSETRON HCL 4 MG PO TABS
4.0000 mg | ORAL_TABLET | Freq: Four times a day (QID) | ORAL | Status: DC | PRN
  Administered 2020-02-15: 4 mg via ORAL
  Filled 2020-02-13: qty 1

## 2020-02-13 MED ORDER — OXYCODONE HCL 5 MG PO TABS
5.0000 mg | ORAL_TABLET | Freq: Once | ORAL | Status: AC | PRN
  Administered 2020-02-13: 5 mg via ORAL

## 2020-02-13 MED ORDER — FENTANYL CITRATE (PF) 250 MCG/5ML IJ SOLN
INTRAMUSCULAR | Status: DC | PRN
Start: 2020-02-13 — End: 2020-02-13
  Administered 2020-02-13: 100 ug via INTRAVENOUS
  Administered 2020-02-13: 50 ug via INTRAVENOUS

## 2020-02-13 MED ORDER — ONDANSETRON HCL 4 MG/2ML IJ SOLN
INTRAMUSCULAR | Status: AC
  Filled 2020-02-13: qty 2

## 2020-02-13 MED ORDER — MIDAZOLAM HCL 2 MG/2ML IJ SOLN
INTRAMUSCULAR | Status: DC | PRN
  Administered 2020-02-13: 2 mg via INTRAVENOUS

## 2020-02-13 MED ORDER — DEXAMETHASONE SODIUM PHOSPHATE 10 MG/ML IJ SOLN
INTRAMUSCULAR | Status: AC
  Filled 2020-02-13: qty 1

## 2020-02-13 MED ORDER — ACETAMINOPHEN 325 MG PO TABS
650.0000 mg | ORAL_TABLET | Freq: Four times a day (QID) | ORAL | Status: DC | PRN
  Administered 2020-02-14: 650 mg via ORAL
  Filled 2020-02-13: qty 2

## 2020-02-13 MED ORDER — SODIUM CHLORIDE 0.9 % IV SOLN
1.0000 g | INTRAVENOUS | Status: DC
  Administered 2020-02-14 – 2020-02-15 (×2): 1 g via INTRAVENOUS
  Filled 2020-02-13 (×2): qty 1

## 2020-02-13 MED ORDER — ONDANSETRON HCL 4 MG/2ML IJ SOLN
4.0000 mg | Freq: Once | INTRAMUSCULAR | Status: AC
  Administered 2020-02-13: 4 mg via INTRAVENOUS
  Filled 2020-02-13: qty 2

## 2020-02-13 MED ORDER — GLYCOPYRROLATE 0.2 MG/ML IJ SOLN
INTRAMUSCULAR | Status: AC
  Filled 2020-02-13: qty 1

## 2020-02-13 SURGICAL SUPPLY — 19 items
BLADE CLIPPER SURG (BLADE) ×3 IMPLANT
BLADE SURG 15 STRL LF DISP TIS (BLADE) ×1 IMPLANT
BLADE SURG 15 STRL SS (BLADE) ×3
BRUSH SCRUB EZ  4% CHG (MISCELLANEOUS) ×3
BRUSH SCRUB EZ 4% CHG (MISCELLANEOUS) ×1 IMPLANT
CANISTER SUCT 3000ML PPV (MISCELLANEOUS) ×3 IMPLANT
COVER WAND RF STERILE (DRAPES) ×3 IMPLANT
DRAPE LAPAROTOMY 77X122 PED (DRAPES) ×3 IMPLANT
ELECT REM PT RETURN 9FT ADLT (ELECTROSURGICAL) ×3
ELECTRODE REM PT RTRN 9FT ADLT (ELECTROSURGICAL) ×1 IMPLANT
GLOVE BIO SURGEON STRL SZ7 (GLOVE) ×3 IMPLANT
GOWN STRL REUS W/ TWL LRG LVL3 (GOWN DISPOSABLE) ×2 IMPLANT
GOWN STRL REUS W/TWL LRG LVL3 (GOWN DISPOSABLE) ×6
NEEDLE HYPO 22GX1.5 SAFETY (NEEDLE) ×3 IMPLANT
NS IRRIG 1000ML POUR BTL (IV SOLUTION) ×3 IMPLANT
PACK BASIN MINOR (MISCELLANEOUS) ×3 IMPLANT
SOL PREP PVP 2OZ (MISCELLANEOUS) ×3
SOLUTION PREP PVP 2OZ (MISCELLANEOUS) ×1 IMPLANT
SPONGE LAP 18X18 RF (DISPOSABLE) ×3 IMPLANT

## 2020-02-13 NOTE — Progress Notes (Signed)
Patient seen and examined by Dr. Damita Dunnings earlier this morning please see note for detailed H&P. 44 year old lady with prior history of GERD, depression presents to ED for possible abscess in cellulitis of the right axilla.  She was initially seen on 02/12/2020 for the same complaints underwent I&D and discharged on clindamycin.  Patient reports the swelling and pain in the right axilla has worsened.  She was admitted to [facility] for evaluation and management of PE abscess and cellulitis of the right axilla. General surgery consulted and she is scheduled for or later today. Meanwhile continue with broad-spectrum IV antibiotics at this time. Patient seen and examined and answered all her questions .   Hosie Poisson MD

## 2020-02-13 NOTE — Anesthesia Preprocedure Evaluation (Signed)
Anesthesia Evaluation  Patient identified by MRN, date of birth, ID band Patient awake    Reviewed: Allergy & Precautions, NPO status , Patient's Chart, lab work & pertinent test results  History of Anesthesia Complications Negative for: history of anesthetic complications  Airway Mallampati: II  TM Distance: >3 FB Neck ROM: Full    Dental no notable dental hx.    Pulmonary neg sleep apnea, neg COPD, Current Smoker and Patient abstained from smoking.,    breath sounds clear to auscultation- rhonchi (-) wheezing      Cardiovascular Exercise Tolerance: Good (-) hypertension(-) CAD, (-) Past MI, (-) Cardiac Stents and (-) CABG  Rhythm:Regular Rate:Normal - Systolic murmurs and - Diastolic murmurs    Neuro/Psych neg Seizures PSYCHIATRIC DISORDERS Anxiety Depression negative neurological ROS     GI/Hepatic Neg liver ROS, GERD  ,  Endo/Other  negative endocrine ROS  Renal/GU negative Renal ROS     Musculoskeletal  (+) Arthritis ,   Abdominal (+) + obese,   Peds  Hematology negative hematology ROS (+)   Anesthesia Other Findings Past Medical History: No date: Anxiety No date: Degenerative disc disease, lumbar No date: Diarrhea No date: Dyspepsia No date: Fatty liver No date: GERD (gastroesophageal reflux disease) No date: IBS (irritable bowel syndrome) No date: Personality disorder ([place]) No date: Polycystic ovarian disease No date: Scoliosis     Comment:  with associated back pain   Reproductive/Obstetrics                             Anesthesia Physical Anesthesia Plan  ASA: II  Anesthesia Plan: General   Post-op Pain Management:    Induction: Intravenous  PONV Risk Score and Plan: 1 and Ondansetron, Dexamethasone and Midazolam  Airway Management Planned: Oral ETT  Additional Equipment:   Intra-op Plan:   Post-operative Plan: Extubation in OR  Informed Consent: I have  reviewed the patients History and Physical, chart, labs and discussed the procedure including the risks, benefits and alternatives for the proposed anesthesia with the patient or authorized representative who has indicated his/her understanding and acceptance.     Dental advisory given  Plan Discussed with: CRNA and Anesthesiologist  Anesthesia Plan Comments:         Anesthesia Quick Evaluation

## 2020-02-13 NOTE — H&P (Signed)
History and Physical    Maureen Underwood UJW:[id number] DOB: 1975-08-23 DOA: 02/13/2020  PCP: Patient, No Pcp Per   Patient coming from: home  I have personally briefly reviewed patient's old medical records in [place]  Chief Complaint: Pain and swelling right axilla  HPI: Maureen Underwood is a 44 y.o. female with medical history significant for depression, IBS, GERD, DDD, treated on 02/12/2020 for abscess and cellulitis right axilla with I&D, discharged on clindamycin, who returns to the emergency room because of increased drainage from the I&D with extension of redness beyond the demarcated area. Reports subjective fever as well as increased pain 10 out of 10 that is constant and unrelenting. Has no chest pain, shortness of breath ED Course: On arrival, she was mildly tachycardic at 97, afebrile, normal vitals. Blood work significant for leukocytosis of 11,700 with lactic acid of 2.2.  EKG as reviewed by me : Normal sinus rhythm with no acute ST-T wave changes. Patient given sepsis fluid bolus, started on vancomycin and Rocephin. Hospitalist consulted for admission  Review of Systems: As per HPI otherwise all other systems on review of systems negative.    Past Medical History:  Diagnosis Date  . Anxiety   . Degenerative disc disease, lumbar   . Diarrhea   . Dyspepsia   . Fatty liver   . GERD (gastroesophageal reflux disease)   . IBS (irritable bowel syndrome)   . Personality disorder ([place])   . Polycystic ovarian disease   . Scoliosis    with associated back pain    Past Surgical History:  Procedure Laterality Date  . CERVICAL BIOPSY  W/ LOOP ELECTRODE EXCISION    . CHOLECYSTECTOMY    . DILATION AND CURETTAGE OF UTERUS     2005  . INCISION AND DRAINAGE PERIRECTAL ABSCESS       reports that she has been smoking cigarettes. She started smoking about 23 years ago. She has been smoking about 0.25 packs per day. She has never used smokeless tobacco. She reports that  she does not drink alcohol and does not use drugs.  Allergies  Allergen Reactions  . Penicillins Hives    Family History  Problem Relation Age of Onset  . Diabetes Mother   . Diabetes Maternal Grandmother   . Hyperlipidemia Maternal Grandmother   . Heart disease Maternal Grandfather   . Hypertension Maternal Grandfather   . Lung cancer Maternal Grandfather   . Pancreatic cancer Maternal Grandfather   . Colon cancer Neg Hx   . Esophageal cancer Neg Hx   . Stomach cancer Neg Hx   . Liver disease Neg Hx       Prior to Admission medications   Medication Sig Start Date End Date Taking? Authorizing Provider  clindamycin (CLEOCIN) 300 MG capsule Take 1 capsule (300 mg total) by mouth 4 (four) times daily for 10 days. 02/11/20 02/21/20  Laban Emperor, PA-C    Physical Exam: Vitals:   02/12/20 2232 02/13/20 0049 02/13/20 0300  BP: 127/80 132/70 125/76  Pulse: 94 80 67  Resp: 18 18 20   Temp: 97.6 F (36.4 C) 98.1 F (36.7 C)   TempSrc: Oral Oral   SpO2: 96% 99% 98%  Weight: 95.3 kg    Height: 5\' 6"  (1.676 m)       Vitals:   02/12/20 2232 02/13/20 0049 02/13/20 0300  BP: 127/80 132/70 125/76  Pulse: 94 80 67  Resp: 18 18 20   Temp: 97.6 F (36.4 C) 98.1 F (36.7  C)   TempSrc: Oral Oral   SpO2: 96% 99% 98%  Weight: 95.3 kg    Height: 5\' 6"  (1.676 m)        Constitutional: Alert and oriented x 3 . Not in any apparent distress HEENT:      Head: Normocephalic and atraumatic.         Eyes: PERLA, EOMI, Conjunctivae are normal. Sclera is non-icteric.       Mouth/Throat: Mucous membranes are moist.       Neck: Supple with no signs of meningismus. Cardiovascular: Regular rate and rhythm. No murmurs, gallops, or rubs. 2+ symmetrical distal pulses are present . No JVD. No LE edema Respiratory: Respiratory effort normal .Lungs sounds clear bilaterally. No wheezes, crackles, or rhonchi.  Gastrointestinal: Soft, non tender, and non distended with positive bowel sounds. No  rebound or guarding. Genitourinary: No CVA tenderness. Musculoskeletal:  Erythema and warmth with tenderness posterior aspect right upper extremity with central area of I&D course axilla, draining. Neurologic:  Face is symmetric. Moving all extremities. No gross focal neurologic deficits . Skin:  Erythema and warmth with tenderness posterior aspect right upper extremity with central area of I&D course axilla, draining.  Psychiatric: Mood and affect are normal    Labs on Admission: I have personally reviewed following labs and imaging studies  CBC: Recent Labs  Lab 02/10/20 1811 02/11/20 1518 02/12/20 2256  WBC 15.5* 15.7* 11.7*  NEUTROABS 11.3* 11.3* 8.0*  HGB 13.8 12.9 13.2  HCT 40.8 36.7 35.5*  MCV 93.8 92.2 89.6  PLT 230 208 539   Basic Metabolic Panel: Recent Labs  Lab 02/10/20 1811 02/11/20 1518 02/12/20 2256  NA 136 134* 138  K 3.5 3.3* 3.0*  CL 101 100 107  CO2 24 24 21*  GLUCOSE 87 102* 132*  BUN 11 9 11   CREATININE 1.10* 0.86 0.85  CALCIUM 9.3 8.6* 8.5*   GFR: Estimated Creatinine Clearance: 100.3 mL/min (by C-G formula based on SCr of 0.85 mg/dL). Liver Function Tests: Recent Labs  Lab 02/10/20 1811 02/12/20 2256  AST 21 20  ALT 35 26  ALKPHOS 75 74  BILITOT 1.9* 0.6  PROT 7.0 6.9  ALBUMIN 3.8 3.4*   No results for input(s): LIPASE, AMYLASE in the last 168 hours. No results for input(s): AMMONIA in the last 168 hours. Coagulation Profile: No results for input(s): INR, PROTIME in the last 168 hours. Cardiac Enzymes: No results for input(s): CKTOTAL, CKMB, CKMBINDEX, TROPONINI in the last 168 hours. BNP (last 3 results) No results for input(s): PROBNP in the last 8760 hours. HbA1C: No results for input(s): HGBA1C in the last 72 hours. CBG: No results for input(s): GLUCAP in the last 168 hours. Lipid Profile: No results for input(s): CHOL, HDL, LDLCALC, TRIG, CHOLHDL, LDLDIRECT in the last 72 hours. Thyroid Function Tests: No results for  input(s): TSH, T4TOTAL, FREET4, T3FREE, THYROIDAB in the last 72 hours. Anemia Panel: No results for input(s): VITAMINB12, FOLATE, FERRITIN, TIBC, IRON, RETICCTPCT in the last 72 hours. Urine analysis:    Component Value Date/Time   COLORURINE AMBER (A) 02/10/2020 1852   APPEARANCEUR CLOUDY (A) 02/10/2020 1852   LABSPEC 1.016 02/10/2020 1852   PHURINE 5.0 02/10/2020 1852   GLUCOSEU NEGATIVE 02/10/2020 1852   HGBUR SMALL (A) 02/10/2020 1852   BILIRUBINUR NEGATIVE 02/10/2020 1852   KETONESUR NEGATIVE 02/10/2020 1852   PROTEINUR NEGATIVE 02/10/2020 1852   UROBILINOGEN 1.0 07/11/2008 1608   NITRITE NEGATIVE 02/10/2020 1852   LEUKOCYTESUR TRACE (A) 02/10/2020 1852  Radiological Exams on Admission: No results found.   Assessment/Plan 44 year old female with history of depression, IBS, GERD, DDD, treated on 02/12/2020 for abscess and cellulitis right axilla with I&D, discharged on clindamycin, who returns to the emergency room because of increased drainage worsening pain and fever    Cellulitis and abscess of right axilla Suspect sepsis ([place]) -Patient with subjective fevers, leukocytosis and elevated lactic acid. Mild tachycardia. No fever likely because of prior treatment with antibiotics -Vancomycin and Rocephin -Continue IV fluids -Surgical consult to evaluate for extension of I&D in the OR    Depression -Continue home medicines    Irritable bowel syndrome with diarrhea -As needed loperamide    DVT prophylaxis: SCDs, for possible surgical intervention in the morning Code Status: full code  Family Communication:  none  Disposition Plan: Back to previous home environment Consults called: Surgery Status:At the time of admission, it appears that the appropriate admission status for this patient is INPATIENT. This is judged to be reasonable and necessary in order to provide the required intensity of service to ensure the patient's safety given the presenting symptoms, physical  exam findings, and initial radiographic and laboratory data in the context of their  Comorbid conditions.   Patient requires inpatient status due to high intensity of service, high risk for further deterioration and high frequency of surveillance required.   I certify that at the point of admission it is my clinical judgment that the patient will require inpatient hospital care spanning beyond [place] MD [facility]     02/13/2020, 3:06 AM

## 2020-02-13 NOTE — Transfer of Care (Signed)
Immediate Anesthesia Transfer of Care Note  Patient: Maureen Underwood  Procedure(s) Performed: INCISION AND DRAINAGE ABSCESS (Right Axilla)  Patient Location: PACU  Anesthesia Type:General  Level of Consciousness: drowsy  Airway & Oxygen Therapy: Patient Spontanous Breathing and Patient connected to face mask oxygen  Post-op Assessment: Report given to RN and Post -op Vital signs reviewed and stable  Post vital signs: Reviewed and stable  Last Vitals:  Vitals Value Taken Time  BP 104/65 02/13/20 1340  Temp    Pulse 74 02/13/20 1342  Resp 13 02/13/20 1342  SpO2 92 % 02/13/20 1342  Vitals shown include unvalidated device data.  Last Pain:  Vitals:   02/13/20 1213  TempSrc: Tympanic  PainSc: 6          Complications: No complications documented.

## 2020-02-13 NOTE — ED Provider Notes (Signed)
[facility] Emergency Department Provider Note  ____________________________________________  Time seen: Approximately 2:47 AM  I have reviewed the triage vital signs and the nursing notes.   HISTORY  Chief Complaint Cellulitis   HPI Maureen Underwood is a 44 y.o. female who presents for evaluation of cellulitis.  Patient was seen here yesterday for an abscess with overlying cellulitis of the right axilla.  The abscess was I&D and patient was sent home on antibiotics.   She received a dose of IV clindamycin before being discharged.  She reports that the abscess was draining a lot during the day today.  This evening when her husband went to change the dressing, he noticed that the redness had spread significantly past the demarcated area.  Patient does report subjective fever and chills at home.  She is complain of 10 out of 10 sharp pain located in the right axilla which is constant and nonradiating.  She denies any chest pain or shortness of breath.  Past Medical History:  Diagnosis Date  . Anxiety   . Degenerative disc disease, lumbar   . Diarrhea   . Dyspepsia   . Fatty liver   . GERD (gastroesophageal reflux disease)   . IBS (irritable bowel syndrome)   . Personality disorder ([place])   . Polycystic ovarian disease   . Scoliosis    with associated back pain    Patient Active Problem List   Diagnosis Date Noted  . History of colonic polyps 12/08/2016  . Idiopathic scoliosis 11/04/2015  . Chronic radicular pain of lower back 11/04/2015  . Tobacco abuse 11/04/2015  . Irritable bowel syndrome with diarrhea 03/27/2015  . HLD (hyperlipidemia) 12/06/2014  . HSIL (high grade squamous intraepithelial lesion) on Pap smear of cervix 12/06/2014  . Anxiety 11/27/2014  . Depression 11/27/2014    Past Surgical History:  Procedure Laterality Date  . CERVICAL BIOPSY  W/ LOOP ELECTRODE EXCISION    . CHOLECYSTECTOMY    . DILATION AND CURETTAGE OF UTERUS      2005  . INCISION AND DRAINAGE PERIRECTAL ABSCESS      Prior to Admission medications   Medication Sig Start Date End Date Taking? Authorizing Provider  azithromycin (ZITHROMAX Z-PAK) 250 MG tablet 2 pills today then 1 pill a day for 4 days 08/30/18   Caryn Section Linden Dolin, PA-C  Cholestyramine POWD 4 g by Does not apply route daily. 12/07/16   Ladell Pier, MD  clindamycin (CLEOCIN) 300 MG capsule Take 1 capsule (300 mg total) by mouth 4 (four) times daily for 10 days. 02/11/20 02/21/20  Laban Emperor, PA-C  colestipol (COLESTID) 1 g tablet Take 1 tablet (1 g total) by mouth 3 (three) times daily as needed. 12/07/16   Ladell Pier, MD  cyclobenzaprine (FLEXERIL) 10 MG tablet Take 1 tablet (10 mg total) by mouth 2 (two) times daily as needed for muscle spasms. 05/16/19   Domenic Moras, PA-C  dicyclomine (BENTYL) 10 MG capsule TAKE 1 CAPSULE BY MOUTH EVERY 8 HOURS AS NEEDED FOR SPASMS. 12/07/16   Ladell Pier, MD  DULoxetine (CYMBALTA) 30 MG capsule TAKE 1 CAPSULE BY MOUTH DAILY. 02/13/17   Ladell Pier, MD  gabapentin (NEURONTIN) 100 MG capsule Take 1 capsule (100 mg total) by mouth 2 (two) times daily. 12/07/16   Ladell Pier, MD  HYDROcodone-acetaminophen (NORCO/VICODIN) 5-325 MG tablet Take 1 tablet by mouth every 6 (six) hours as needed for moderate pain. 12/21/17   Johnn Hai, PA-C  ibuprofen (ADVIL) 600 MG tablet Take 1 tablet (600 mg total) by mouth every 6 (six) hours as needed. 05/16/19   Domenic Moras, PA-C  Norgestimate-Ethinyl Estradiol Triphasic (ORTHO TRI-CYCLEN LO) 0.18/0.215/0.25 MG-25 MCG tab Take 1 tablet by mouth daily. 12/07/16   Ladell Pier, MD  omeprazole (PRILOSEC) 20 MG capsule TAKE 1 CAPSULE BY MOUTH 2 TIMES DAILY BEFORE A MEAL. 12/07/16   Ladell Pier, MD  ondansetron (ZOFRAN ODT) 8 MG disintegrating tablet Take 1 tablet (8 mg total) by mouth every 8 (eight) hours as needed for nausea or vomiting. 12/07/16   Ladell Pier, MD  OVER THE  COUNTER MEDICATION     [provider]  oxyCODONE-acetaminophen (PERCOCET) 5-325 MG tablet Take 2 tablets by mouth every 6 (six) hours as needed for moderate pain or severe pain. 06/13/19   Earleen Newport, MD  promethazine (PHENERGAN) 25 MG tablet Take 1 tablet (25 mg total) by mouth every 6 (six) hours as needed for nausea or vomiting. 08/30/18   Caryn Section Linden Dolin, PA-C  QUEtiapine (SEROQUEL) 100 MG tablet TAKE 1 TABLET BY MOUTH AT BEDTIME. 02/13/17   Ladell Pier, MD    Allergies Penicillins  Family History  Problem Relation Age of Onset  . Diabetes Mother   . Diabetes Maternal Grandmother   . Hyperlipidemia Maternal Grandmother   . Heart disease Maternal Grandfather   . Hypertension Maternal Grandfather   . Lung cancer Maternal Grandfather   . Pancreatic cancer Maternal Grandfather   . Colon cancer Neg Hx   . Esophageal cancer Neg Hx   . Stomach cancer Neg Hx   . Liver disease Neg Hx     Social History Social History   Tobacco Use  . Smoking status: Current Every Day Smoker    Packs/day: 0.25    Types: Cigarettes    Start date: 01/06/1997  . Smokeless tobacco: Never Used  . Tobacco comment: smoked since 95; form given 03-01-16  Vaping Use  . Vaping Use: Never used  Substance Use Topics  . Alcohol use: No    Alcohol/week: 0.0 standard drinks  . Drug use: No    Review of Systems  Constitutional: + subjective fever and chills Eyes: Negative for visual changes. ENT: Negative for sore throat. Neck: No neck pain  Cardiovascular: Negative for chest pain. Respiratory: Negative for shortness of breath. Gastrointestinal: Negative for abdominal pain, vomiting or diarrhea. Genitourinary: Negative for dysuria. Musculoskeletal: Negative for back pain. Skin: Negative for rash. + R axilla skin infection Neurological: Negative for headaches, weakness or numbness. Psych: No SI or HI  ____________________________________________   PHYSICAL EXAM:  VITAL  SIGNS: ED Triage Vitals  Enc Vitals Group     BP 02/12/20 2232 127/80     Pulse Rate 02/12/20 2232 94     Resp 02/12/20 2232 18     Temp 02/12/20 2232 97.6 F (36.4 C)     Temp Source 02/12/20 2232 Oral     SpO2 02/12/20 2232 96 %     Weight 02/12/20 2232 210 lb (95.3 kg)     Height 02/12/20 2232 5\' 6"  (1.676 m)     Head Circumference --      Peak Flow --      Pain Score 02/12/20 2231 10     Pain Loc --      Pain Edu? --      Excl. in [place]? --     Constitutional: Alert and oriented. Well appearing and in no apparent  distress. HEENT:      Head: Normocephalic and atraumatic.         Eyes: Conjunctivae are normal. Sclera is non-icteric.       Mouth/Throat: Mucous membranes are moist.       Neck: Supple with no signs of meningismus. Cardiovascular: Regular rate and rhythm. No murmurs, gallops, or rubs. Respiratory: Normal respiratory effort. Lungs are clear to auscultation bilaterally. Gastrointestinal: Soft, non tender. Musculoskeletal: There is large fluctuance located in the R axilla which is draining pus through the I&D site. There is significant amount of erythema and warmth extending way past the demarcated area. No crepitus or bullae Neurologic: Normal speech and language. Face is symmetric. Moving all extremities. No gross focal neurologic deficits are appreciated. Skin: Skin is warm, dry and intact. No rash noted. Psychiatric: Mood and affect are normal. Speech and behavior are normal.  ____________________________________________   LABS (all labs ordered are listed, but only abnormal results are displayed)  Labs Reviewed  LACTIC ACID, PLASMA - Abnormal; Notable for the following components:      Result Value   Lactic Acid, Venous 2.2 (*)    All other components within normal limits  COMPREHENSIVE METABOLIC PANEL - Abnormal; Notable for the following components:   Potassium 3.0 (*)    CO2 21 (*)    Glucose, Bld 132 (*)    Calcium 8.5 (*)    Albumin 3.4 (*)    All  other components within normal limits  CBC WITH DIFFERENTIAL/PLATELET - Abnormal; Notable for the following components:   WBC 11.7 (*)    HCT 35.5 (*)    MCHC 37.2 (*)    Neutro Abs 8.0 (*)    All other components within normal limits  CULTURE, BLOOD (ROUTINE X 2)  CULTURE, BLOOD (ROUTINE X 2)  RESPIRATORY PANEL BY RT PCR (FLU A&B, COVID)  LACTIC ACID, PLASMA  URINALYSIS, COMPLETE (UACMP) WITH MICROSCOPIC  POC URINE PREG, ED   ____________________________________________  EKG  none  ____________________________________________  RADIOLOGY  none  ____________________________________________   PROCEDURES  Procedure(s) performed: yes .1-3 Lead EKG Interpretation Performed by: Rudene Re, MD Authorized by: Rudene Re, MD     Interpretation: non-specific     ECG rate assessment: normal     Rhythm: sinus rhythm     Ectopy: none     Critical Care performed: yes  CRITICAL CARE Performed by: Rudene Re  ?  Total critical care time: 35 min  Critical care time was exclusive of separately billable procedures and treating other patients.  Critical care was necessary to treat or prevent imminent or life-threatening deterioration.  Critical care was time spent personally by me on the following activities: development of treatment plan with patient and/or surrogate as well as nursing, discussions with consultants, evaluation of patient's response to treatment, examination of patient, obtaining history from patient or surrogate, ordering and performing treatments and interventions, ordering and review of laboratory studies, ordering and review of radiographic studies, pulse oximetry and re-evaluation of patient's condition.  ____________________________________________   INITIAL IMPRESSION / ASSESSMENT AND PLAN / ED COURSE   44 y.o. female who presents for evaluation of cellulitis.  Patient was here yesterday with a right axilla abscess which was I&D.   The incision site is still open and draining pus.  There is still a large fluctuance located in the right axilla with significant overlying erythema and warmth which extend past the demarcated area.  No signs of necrotizing infection.  Patient is afebrile with no tachycardia  but does have a white count and elevated lactic acid concerning for early sepsis.  Sepsis protocol initiated.  Will cover with IV Rocephin and vancomycin.  Will give IV morphine and Toradol for pain.  Will discuss with the hospitalist for admission.  Old medical records reviewed.  Patient placed on telemetry for close monitoring.      _____________________________________________ Please note:  Patient was evaluated in Emergency Department today for the symptoms described in the history of present illness. Patient was evaluated in the context of the global COVID-19 pandemic, which necessitated consideration that the patient might be at risk for infection with the SARS-CoV-2 virus that causes COVID-19. Institutional protocols and algorithms that pertain to the evaluation of patients at risk for COVID-19 are in a state of rapid change based on information released by regulatory bodies including the CDC and federal and state organizations. These policies and algorithms were followed during the patient's care in the ED.  Some ED evaluations and interventions may be delayed as a result of limited staffing during the pandemic.   [place] Controlled Substance Database was reviewed by me. ____________________________________________   FINAL CLINICAL IMPRESSION(S) / ED DIAGNOSES   Final diagnoses:  Sepsis without acute organ dysfunction, due to unspecified organism ([place])  Cellulitis of left axilla      NEW MEDICATIONS STARTED DURING THIS VISIT:  ED Discharge Orders    None       Note:  This document was prepared using Dragon voice recognition software and may include unintentional dictation errors.    Alfred Levins, [place],  MD 02/13/20 [phone]

## 2020-02-13 NOTE — Progress Notes (Signed)
Admitted to room 129 from ED. Alert and oriented. Right axilla red and swollen. Lungs clear. Explained admission process. Family knows she is admitted

## 2020-02-13 NOTE — Progress Notes (Signed)
PHARMACY -  BRIEF ANTIBIOTIC NOTE   Pharmacy has received consult(s) for Vancomycin from an ED provider.  The patient's profile has been reviewed for ht/wt/allergies/indication/available labs.    One time order(s) placed for Vancomycin 2 gm IV X 1   Further antibiotics/pharmacy consults should be ordered by admitting physician if indicated.                       Thank you, Rai Sinagra D 02/13/2020  2:26 AM

## 2020-02-13 NOTE — ED Notes (Signed)
Admitting at the bedside.  

## 2020-02-13 NOTE — Anesthesia Postprocedure Evaluation (Signed)
Anesthesia Post Note  Patient: Maureen Underwood  Procedure(s) Performed: INCISION AND DRAINAGE ABSCESS (Right Axilla)  Patient location during evaluation: PACU Anesthesia Type: General Level of consciousness: awake and alert and oriented Pain management: pain level controlled Vital Signs Assessment: post-procedure vital signs reviewed and stable Respiratory status: spontaneous breathing, nonlabored ventilation and respiratory function stable Cardiovascular status: blood pressure returned to baseline and stable Postop Assessment: no signs of nausea or vomiting Anesthetic complications: no   No complications documented.   Last Vitals:  Vitals:   02/13/20 1410 02/13/20 1425  BP: 107/71   Pulse: 75   Resp: 15   Temp:  (!) 36.4 C  SpO2: 96% 93%    Last Pain:  Vitals:   02/13/20 1425  TempSrc:   PainSc: 0-No pain                 Frenchie Dangerfield

## 2020-02-13 NOTE — Op Note (Signed)
  02/13/2020  1:35 PM  PATIENT:  [place] P[place]nor  44 y.o. female  PRE-OPERATIVE DIAGNOSIS:  Right Axillary complex abscess  POST-OPERATIVE DIAGNOSIS:  Same  PROCEDURE:   1. Incision and drainage of complex pilonidal abscess 2. Sharp debridement of skin subcutaneous tissue and muscle measuring 9 square centimeters    SURGEON:  Surgeon(s) and Role:    * Lanard Arguijo F, MD - Primary   ANESTHESIA: General LMA   DICTATION:  Patient was explained about the procedure in detail.  Risks, benefits, possible complications and a consent was obtained. The patient taken to the operating room and placed in the supine position.   Patient was prepped and draped in the usual sterile fashion.  We identified the axillary abscess and obtain appropriate cultures.  Elliptical incision was created to drain the complex abscess.  Multiple loculations were broken down with suction device.  We proceeded to debride the skin subtenons tissue and the muscle with a curette in the standard fashion.  Electrocautery was used to obtain good hemostasis.  The wound was packed with plain packing. Immediate complications.  Needle and laparotomy counts were correct        Jules Husbands, MD

## 2020-02-13 NOTE — ED Notes (Signed)
Pt up to restroom with steady gait.

## 2020-02-13 NOTE — Progress Notes (Signed)
CODE SEPSIS - PHARMACY COMMUNICATION  **Broad Spectrum Antibiotics should be administered within 1 hour of Sepsis diagnosis**  Time Code Sepsis Called/Page Received: 10/06 @ 0222  Antibiotics Ordered: Vanc, Ceftriaxone   Time of 1st antibiotic administration: Ceftriaxone 2 gm IV X 1 on 10/06 @ 0307  Additional action taken by pharmacy:   If necessary, Name of Provider/Nurse Contacted:     Gayle Collard D ,PharmD Clinical Pharmacist  02/13/2020  3:09 AM

## 2020-02-13 NOTE — Progress Notes (Signed)
Surgery consent signed. All jewelry off, glasses in room. To OR via bed. Tele off in room.

## 2020-02-13 NOTE — Consult Note (Signed)
[facility] SURGICAL CONSULTATION NOTE (initial) - cpt: [id number]   HISTORY OF PRESENT ILLNESS (HPI):  44 y.o. female presented to [facility] ED overnight for evaluation of right axillary pain, swelling, erythema. Patient initially presented to [facility] ED von 10/04 for similar. At that time, she was having swelling, erythema, and pain to there right axilla which was radiating down her right arm. This had been on-going, and worsening for 3 days prior to presentation on 10/04. She was found to have a leukocytosis to 15.7K and underwent I&D by the EDP at that time. She was discharged with clindamycin but never filled this. She presents again to the ED last night for lack of improvement in her symptoms and worsening of her erythema beyond areas marked previously in the ED. Her pain at time of re-presentation was a 10/10 and she got no relief. She has a history of similar abscesses in the past, and she did require I&D in the OR for previous peri-rectal abscess. These first started in her late teens. Work up in the ED last night revealed mild leukocytosis to 11.7K and lactic acidosis to 2.2, which is now resolved after IVF. She was admitted to medicine service and started on Ceftriaxone and Vancomycin.   Surgery is consulted by hospitalist physician Dr. Judd Gaudier, MD in this context for evaluation and management of right axillary cellulitis and abscess.  PAST MEDICAL HISTORY (PMH):  Past Medical History:  Diagnosis Date  . Anxiety   . Degenerative disc disease, lumbar   . Diarrhea   . Dyspepsia   . Fatty liver   . GERD (gastroesophageal reflux disease)   . IBS (irritable bowel syndrome)   . Personality disorder ([place])   . Polycystic ovarian disease   . Scoliosis    with associated back pain     PAST SURGICAL HISTORY ([place]):  Past Surgical History:  Procedure Laterality Date  . CERVICAL BIOPSY  W/ LOOP ELECTRODE EXCISION    . CHOLECYSTECTOMY    . DILATION AND CURETTAGE OF UTERUS     2005   . INCISION AND DRAINAGE PERIRECTAL ABSCESS       MEDICATIONS:  Prior to Admission medications   Medication Sig Start Date End Date Taking? Authorizing Provider  clindamycin (CLEOCIN) 300 MG capsule Take 1 capsule (300 mg total) by mouth 4 (four) times daily for 10 days. 02/11/20 02/21/20  Laban Emperor, PA-C     ALLERGIES:  Allergies  Allergen Reactions  . Penicillins Hives     SOCIAL HISTORY:  Social History   Socioeconomic History  . Marital status: Married    Spouse name: Not on file  . Number of children: Not on file  . Years of education: Not on file  . Highest education level: Not on file  Occupational History  . Not on file  Tobacco Use  . Smoking status: Current Every Day Smoker    Packs/day: 0.25    Types: Cigarettes    Start date: 01/06/1997  . Smokeless tobacco: Never Used  . Tobacco comment: smoked since 61; form given 03-01-16  Vaping Use  . Vaping Use: Never used  Substance and Sexual Activity  . Alcohol use: No    Alcohol/week: 0.0 standard drinks  . Drug use: No  . Sexual activity: Not Currently    Birth control/protection: None  Other Topics Concern  . Not on file  Social History Narrative  . Not on file   Social Determinants of Health   Financial Resource Strain:   .  Difficulty of Paying Living Expenses: Not on file  Food Insecurity:   . Worried About Charity fundraiser in the Last Year: Not on file  . Ran Out of Food in the Last Year: Not on file  Transportation Needs:   . Lack of Transportation (Medical): Not on file  . Lack of Transportation (Non-Medical): Not on file  Physical Activity:   . Days of Exercise per Week: Not on file  . Minutes of Exercise per Session: Not on file  Stress:   . Feeling of Stress : Not on file  Social Connections:   . Frequency of Communication with Friends and Family: Not on file  . Frequency of Social Gatherings with Friends and Family: Not on file  . Attends Religious Services: Not on file  . Active  Member of Clubs or Organizations: Not on file  . Attends Archivist Meetings: Not on file  . Marital Status: Not on file  Intimate Partner Violence:   . Fear of Current or Ex-Partner: Not on file  . Emotionally Abused: Not on file  . Physically Abused: Not on file  . Sexually Abused: Not on file     FAMILY HISTORY:  Family History  Problem Relation Age of Onset  . Diabetes Mother   . Diabetes Maternal Grandmother   . Hyperlipidemia Maternal Grandmother   . Heart disease Maternal Grandfather   . Hypertension Maternal Grandfather   . Lung cancer Maternal Grandfather   . Pancreatic cancer Maternal Grandfather   . Colon cancer Neg Hx   . Esophageal cancer Neg Hx   . Stomach cancer Neg Hx   . Liver disease Neg Hx       REVIEW OF SYSTEMS:  Review of Systems  Constitutional: Positive for fever (Subjective). Negative for chills.  Respiratory: Negative for cough and shortness of breath.   Cardiovascular: Negative for chest pain and palpitations.  Gastrointestinal: Negative for abdominal pain, nausea and vomiting.  Genitourinary: Negative for dysuria and urgency.  Skin:       + erythema, Swelling, tenderness to the right axilla  All other systems reviewed and are negative.   VITAL SIGNS:  Temp:  [97.6 F (36.4 C)-98.1 F (36.7 C)] 98.1 F (36.7 C) (10/06 0049) Pulse Rate:  [67-94] 72 (10/06 0500) Resp:  [18-20] 18 (10/06 0500) BP: (112-132)/(69-80) 112/69 (10/06 0500) SpO2:  [96 %-99 %] 98 % (10/06 0500) Weight:  [95.3 kg] 95.3 kg (10/05 2232)     Height: 5\' 6"  (167.6 cm) Weight: 95.3 kg BMI (Calculated): 33.91   INTAKE/OUTPUT:  10/05 0701 - 10/06 0700 In: 99.1 [IV Piggyback:99.1] Out: -   PHYSICAL EXAM:  Physical Exam Vitals and nursing note reviewed.  Constitutional:      General: She is not in acute distress.    Appearance: Normal appearance. She is obese. She is not ill-appearing.  HENT:     Head: Normocephalic and atraumatic.  Eyes:     General: No  scleral icterus.    Conjunctiva/sclera: Conjunctivae normal.     Pupils: Pupils are equal, round, and reactive to light.  Cardiovascular:     Rate and Rhythm: Normal rate and regular rhythm.     Pulses: Normal pulses.     Heart sounds: No murmur heard.   Pulmonary:     Effort: Pulmonary effort is normal. No respiratory distress.     Breath sounds: Normal breath sounds.  Genitourinary:    Comments: Deferred Musculoskeletal:     Right lower leg: No  edema.     Left lower leg: No edema.  Skin:    General: Skin is warm and dry.     Findings: Abscess and erythema present.       Neurological:     General: No focal deficit present.     Mental Status: She is alert and oriented to person, place, and time.  Psychiatric:        Mood and Affect: Mood normal.        Behavior: Behavior normal.      Labs:  CBC Latest Ref Rng & Units 02/12/2020 02/11/2020 02/10/2020  WBC 4.0 - 10.5 K/uL 11.7(H) 15.7(H) 15.5(H)  Hemoglobin 12.0 - 15.0 g/dL 13.2 12.9 13.8  Hematocrit 36 - 46 % 35.5(L) 36.7 40.8  Platelets 150 - 400 K/uL 259 208 230   CMP Latest Ref Rng & Units 02/12/2020 02/11/2020 02/10/2020  Glucose 70 - 99 mg/dL 132(H) 102(H) 87  BUN 6 - 20 mg/dL 11 9 11   Creatinine 0.44 - 1.00 mg/dL 0.85 0.86 1.10(H)  Sodium 135 - 145 mmol/L 138 134(L) 136  Potassium 3.5 - 5.1 mmol/L 3.0(L) 3.3(L) 3.5  Chloride 98 - 111 mmol/L 107 100 101  CO2 22 - 32 mmol/L 21(L) 24 24  Calcium 8.9 - 10.3 mg/dL 8.5(L) 8.6(L) 9.3  Total Protein 6.5 - 8.1 g/dL 6.9 - 7.0  Total Bilirubin 0.3 - 1.2 mg/dL 0.6 - 1.9(H)  Alkaline Phos 38 - 126 U/L 74 - 75  AST 15 - 41 U/L 20 - 21  ALT 0 - 44 U/L 26 - 35    Imaging studies:  No new imaging studies   Assessment/Plan: (ICD-10's: L03.111) 44 y.o. female with right axillary cellulitis with likely underlying undrained abscess, potentially undiagnosed hidradenitis given frequent recurrent abscesses in this area.   - Concern for undrained abscess, will plan for formal I&D  in the OR this afternoon with Dr Dahlia Byes.   - All risks, benefits, and alternatives to above procedure(s) were discussed with the patient, all of her questions were answered to her expressed satisfaction, patient expresses she wishes to proceed, and informed consent was obtained.  - Remain NPO  - Continue IV ABx (Rocephin + Vancomycin); will obtain Cx in the OR  - Pain control prn  - Monitor leukocytosis   - DVT prophylaxis; hold for OR  - Further management per primary service  All of the above findings and recommendations were discussed with the patient, and all of patient's questions were answered to her expressed satisfaction.  Thank you for the opportunity to participate in this patient's care.   -- Edison Simon, PA-C [facility] 02/13/2020, 7:45 AM [phone] M-F: 7am - 4pm

## 2020-02-13 NOTE — Progress Notes (Signed)
Pharmacy Antibiotic Note  Maureen Underwood is a 44 y.o. female admitted on 02/13/2020 with sepsis.  Pharmacy has been consulted for Vancomycin  dosing.  Plan: Vancomycin 2 gm IV X 1 ordered to be given on 10/06 @ ~ 0300. Vancomycin 1 gm IV Q8H ordered to begin on 10/06 @ 1100.   Height: 5\' 6"  (167.6 cm) Weight: 95.3 kg (210 lb) IBW/kg (Calculated) : 59.3  Temp (24hrs), Avg:97.9 F (36.6 C), Min:97.6 F (36.4 C), Max:98.1 F (36.7 C)  Recent Labs  Lab 02/10/20 1811 02/11/20 1518 02/12/20 2256  WBC 15.5* 15.7* 11.7*  CREATININE 1.10* 0.86 0.85  LATICACIDVEN  --   --  2.2*    Estimated Creatinine Clearance: 100.3 mL/min (by C-G formula based on SCr of 0.85 mg/dL).    Allergies  Allergen Reactions  . Penicillins Hives    Antimicrobials this admission:   >>    >>   Dose adjustments this admission:   Microbiology results:  BCx:   UCx:    Sputum:    MRSA PCR:   Thank you for allowing pharmacy to be a part of this patient's care.  Maureen Underwood D 02/13/2020 3:12 AM

## 2020-02-13 NOTE — Anesthesia Procedure Notes (Signed)
Procedure Name: Intubation Date/Time: 02/13/2020 12:47 PM Performed by: Eben Burow, CRNA Pre-anesthesia Checklist: Patient identified, Emergency Drugs available, Suction available and Patient being monitored Patient Re-evaluated:Patient Re-evaluated prior to induction Oxygen Delivery Method: Circle system utilized Preoxygenation: Pre-oxygenation with 100% oxygen Induction Type: IV induction Ventilation: Mask ventilation without difficulty Laryngoscope Size: Mac and 3 Grade View: Grade I Tube type: Oral Tube size: 7.0 mm Number of attempts: 1 Airway Equipment and Method: Stylet Placement Confirmation: ETT inserted through vocal cords under direct vision,  positive ETCO2 and breath sounds checked- equal and bilateral Secured at: 21 cm Tube secured with: Tape Dental Injury: Teeth and Oropharynx as per pre-operative assessment

## 2020-02-14 ENCOUNTER — Encounter: Payer: Self-pay | Admitting: Surgery

## 2020-02-14 DIAGNOSIS — F32A Depression, unspecified: Secondary | ICD-10-CM

## 2020-02-14 LAB — CREATININE, SERUM
Creatinine, Ser: 0.77 mg/dL (ref 0.44–1.00)
GFR calc non Af Amer: >60 mL/min (ref >60)

## 2020-02-14 MED ORDER — ENOXAPARIN SODIUM 40 MG/0.4ML ~~LOC~~ SOLN
40.0000 mg | SUBCUTANEOUS | Status: DC
  Administered 2020-02-14: 40 mg via SUBCUTANEOUS
  Filled 2020-02-14: qty 0.4

## 2020-02-14 MED ORDER — VANCOMYCIN HCL IN DEXTROSE 1-5 GM/200ML-% IV SOLN
1000.0000 mg | Freq: Two times a day (BID) | INTRAVENOUS | Status: DC
  Administered 2020-02-14 – 2020-02-15 (×2): 1000 mg via INTRAVENOUS
  Filled 2020-02-14 (×4): qty 200

## 2020-02-14 MED ORDER — MAGNESIUM HYDROXIDE 400 MG/5ML PO SUSP
30.0000 mL | Freq: Every evening | ORAL | Status: DC | PRN
  Administered 2020-02-15: 30 mL via ORAL
  Filled 2020-02-14: qty 30

## 2020-02-14 MED ORDER — NICOTINE 21 MG/24HR TD PT24
21.0000 mg | MEDICATED_PATCH | Freq: Every day | TRANSDERMAL | Status: DC
  Administered 2020-02-14 – 2020-02-15 (×2): 21 mg via TRANSDERMAL
  Filled 2020-02-14 (×2): qty 1

## 2020-02-14 NOTE — Progress Notes (Addendum)
[place] Hospital Day(s): 1.   Post op day(s): 1 Day Post-Op.   Interval History:  Patient seen and examined no acute events or new complaints overnight.  Patient reports she is feeling "so much better"  She still has soreness in her right axilla but this is markedly improved No fever, chills No new labs this morning Cx from OR pending  Regular diet; tolerating well  Vital signs in last 24 hours: [min-max] current  Temp:  [97.1 F (36.2 C)-98.5 F (36.9 C)] 98.5 F (36.9 C) (10/07 0728) Pulse Rate:  [55-85] 61 (10/07 0728) Resp:  [13-20] 17 (10/07 0728) BP: (96-121)/(54-86) 109/54 (10/07 0728) SpO2:  [91 %-100 %] 98 % (10/07 0728) Weight:  [95.3 kg] 95.3 kg (10/06 1213)     Height: 5\' 6"  (167.6 cm) Weight: 95.3 kg BMI (Calculated): 33.93   Intake/Output last 2 shifts:  10/06 0701 - 10/07 0700 In: 2207.1 [P.O.:200; I.V.:1307.1; IV Piggyback:700] Out: 5 [Blood:5]   Physical Exam:  Constitutional: alert, cooperative and no distress  Respiratory: breathing non-labored at rest Integumentary: Right axillary incision, packing recently changed, still with surrounding erythema and induration but this is markedly improved. No fluctuance or crepitus.   Labs:  CBC Latest Ref Rng & Units 02/12/2020 02/11/2020 02/10/2020  WBC 4.0 - 10.5 K/uL 11.7(H) 15.7(H) 15.5(H)  Hemoglobin 12.0 - 15.0 g/dL 13.2 12.9 13.8  Hematocrit 36 - 46 % 35.5(L) 36.7 40.8  Platelets 150 - 400 K/uL 259 208 230   CMP Latest Ref Rng & Units 02/13/2020 02/12/2020 02/11/2020  Glucose 70 - 99 mg/dL 100(H) 132(H) 102(H)  BUN 6 - 20 mg/dL 7 11 9   Creatinine 0.44 - 1.00 mg/dL 0.79 0.85 0.86  Sodium 135 - 145 mmol/L 141 138 134(L)  Potassium 3.5 - 5.1 mmol/L 3.6 3.0(L) 3.3(L)  Chloride 98 - 111 mmol/L 109 107 100  CO2 22 - 32 mmol/L 25 21(L) 24  Calcium 8.9 - 10.3 mg/dL 8.3(L) 8.5(L) 8.6(L)  Total Protein 6.5 - 8.1 g/dL - 6.9 -  Total Bilirubin 0.3 - 1.2 mg/dL - 0.6 -   Alkaline Phos 38 - 126 U/L - 74 -  AST 15 - 41 U/L - 20 -  ALT 0 - 44 U/L - 26 -     Imaging studies: No new pertinent imaging studies   Assessment/Plan:  44 y.o. female with clinical improvement 1 Day Post-Op s/p incision and drainage of right axillary abscess.   - Okay to continue regular diet  - Continue IV ABx (Rocephin + Vancomycin); Day 2/10; will obtain Cx in the OR             - Pain control prn  - Wound Care: Saline moistened gauze/iodofrm, cover with dray gauze, secure with tape....this should be done daily             - DVT prophylaxis; hold for OR             - Further management per primary service   - Discharge Planning: Okay for discharge from surgical standpoint, I reviewed wound care with the patient this morning, Recommend 10 days total ABx, follow up wound Cx from OR and narrow, I will be happy to follow up with her in my clinic in 1-2 weeks after discharge   All of the above findings and recommendations were discussed with the patient, and the medical team, and all of patient's questions were answered to her expressed satisfaction.  -- Edison Simon, PA-C  [facility] 02/14/2020, 7:36 AM [phone] M-F: 7am - 4pm

## 2020-02-14 NOTE — Progress Notes (Signed)
PROGRESS NOTE    Maureen Underwood  EXB:[id number] DOB: Dec 12, 1975 DOA: 02/13/2020 PCP: Patient, No Pcp Per    Chief Complaint  Patient presents with  . Cellulitis    Brief Narrative: 44 year old lady with prior history of GERD, depression presents to ED for possible abscess in cellulitis of the right axilla.  She was initially seen on 02/12/2020 for the same complaints underwent I&D and discharged on clindamycin.  Patient reports the swelling and pain in the right axilla has worsened.  She was admitted to [facility] for evaluation and management of PE abscess and cellulitis of the right axilla. General surgery consulted and she  Underwent Incision and drainage of the right axillary abscess by general surgery on 02/13/2020.  Cultures from the I&D are pending currently show gram-positive cocci. Meanwhile continue with broad-spectrum IV antibiotics at this time. Pain control with IV morphine.   Assessment & Plan:   Principal Problem:   Cellulitis of right axilla Active Problems:   Depression   Irritable bowel syndrome with diarrhea   Sepsis ruled out. She had SIRS, with  tachycardia HR of 97/min, lactic acid of 2.2 , afebrile , normotensive, and wbc count of 11,700.    Cellulitis and abscess of the right axillary area On admission, pt was tachy Improving s/p I&D on 02/13/2020.  Currently on broad-spectrum IV antibiotics continue the same until we get final cultures. Wound care as per general surgery. Saline moistened gauze/iodofrm, cover with dray gauze, secure with tape....this should be done daily. Pain control with IV morphine. Cultures from I&  D pending and show gram positive cocci, further Identification and sensitivities are pending.     Tobacco abuse:  Cessation counseling given and nicotine patch ordered.    Hypokalemia:  Replaced.         DVT prophylaxis: (Lovenox) Code Status: (Full code.  Family Communication: None at bedside   disposition:   Status is:  Inpatient  Remains inpatient appropriate because:IV treatments appropriate due to intensity of illness or inability to take PO   Dispo: The patient is from: Home              Anticipated d/c is to: Home              Anticipated d/c date is: 1 day              Patient currently is not medically stable to d/c.       Consultants:   General surgery   Procedures: Incision and drainage of the right axillary abscess by general surgery on 02/13/2020  Antimicrobials:  Antibiotics Given (last 72 hours)    Date/Time Action Medication Dose Rate   02/13/20 0307 New Bag/Given   cefTRIAXone (ROCEPHIN) 2 g in sodium chloride 0.9 % 100 mL IVPB 2 g 200 mL/hr   02/13/20 0343 New Bag/Given   vancomycin (VANCOREADY) IVPB 2000 mg/400 mL 2,000 mg 200 mL/hr   02/13/20 1233 New Bag/Given   vancomycin (VANCOCIN) IVPB 1000 mg/200 mL premix 1,000 mg 200 mL/hr   02/13/20 1837 New Bag/Given   vancomycin (VANCOCIN) IVPB 1000 mg/200 mL premix 1,000 mg 200 mL/hr   02/14/20 0243 New Bag/Given   cefTRIAXone (ROCEPHIN) 1 g in sodium chloride 0.9 % 100 mL IVPB 1 g 200 mL/hr   02/14/20 0335 New Bag/Given   vancomycin (VANCOCIN) IVPB 1000 mg/200 mL premix 1,000 mg 200 mL/hr   02/14/20 1109 New Bag/Given   vancomycin (VANCOCIN) IVPB 1000 mg/200 mL premix 1,000 mg 200 mL/hr  Subjective: Improving pain, requesting nicotine patch.   Objective: Vitals:   02/13/20 2354 02/14/20 0451 02/14/20 0728 02/14/20 1152  BP: (!) 96/58 102/62 (!) 109/54 118/84  Pulse: 67 (!) 59 61 66  Resp: 20 16 17 16   Temp: 98.1 F (36.7 C) 98.1 F (36.7 C) 98.5 F (36.9 C) 98.8 F (37.1 C)  TempSrc: Oral Oral Oral   SpO2: 96% 97% 98% 99%  Weight:      Height:        Intake/Output Summary (Last 24 hours) at 02/14/2020 1221 Last data filed at 02/14/2020 0600 Gross per 24 hour  Intake 2207.12 ml  Output 5 ml  Net 2202.12 ml   Filed Weights   02/12/20 2232 02/13/20 1213  Weight: 95.3 kg 95.3 kg     Examination:  General exam: Appears calm and comfortable  Respiratory system: Clear to auscultation. Respiratory effort normal. Cardiovascular system: S1 & S2 heard, RRR. No JVD, . No pedal edema. Gastrointestinal system: Abdomen is nondistended, soft and nontender. Normal bowel sounds heard. Central nervous system: Alert and oriented. No focal neurological deficits. Extremities: right axillary swelling improved, with surrounding redness, improved from yesterday.  Skin: the erythema in the right axilla is improving.  Psychiatry:  Mood & affect appropriate.     Data Reviewed: I have personally reviewed following labs and imaging studies  CBC: Recent Labs  Lab 02/10/20 1811 02/11/20 1518 02/12/20 2256  WBC 15.5* 15.7* 11.7*  NEUTROABS 11.3* 11.3* 8.0*  HGB 13.8 12.9 13.2  HCT 40.8 36.7 35.5*  MCV 93.8 92.2 89.6  PLT 230 208 419    Basic Metabolic Panel: Recent Labs  Lab 02/10/20 1811 02/11/20 1518 02/12/20 2256 02/13/20 0854  NA 136 134* 138 141  K 3.5 3.3* 3.0* 3.6  CL 101 100 107 109  CO2 24 24 21* 25  GLUCOSE 87 102* 132* 100*  BUN 11 9 11 7   CREATININE 1.10* 0.86 0.85 0.79  CALCIUM 9.3 8.6* 8.5* 8.3*    GFR: Estimated Creatinine Clearance: 106.6 mL/min (by C-G formula based on SCr of 0.79 mg/dL).  Liver Function Tests: Recent Labs  Lab 02/10/20 1811 02/12/20 2256  AST 21 20  ALT 35 26  ALKPHOS 75 74  BILITOT 1.9* 0.6  PROT 7.0 6.9  ALBUMIN 3.8 3.4*    CBG: No results for input(s): GLUCAP in the last 168 hours.   Recent Results (from the past 240 hour(s))  Culture, blood (Routine x 2)     Status: None (Preliminary result)   Collection Time: 02/12/20 10:56 PM   Specimen: BLOOD  Result Value Ref Range Status   Specimen Description BLOOD LEFT HAND  Final   Special Requests   Final    BOTTLES DRAWN AEROBIC AND ANAEROBIC Blood Culture results may not be optimal due to an excessive volume of blood received in culture bottles   Culture   Final     NO GROWTH 2 DAYS Performed at [facility], [address] [place], [place] [address]    Report Status PENDING  Incomplete  Culture, blood (Routine x 2)     Status: None (Preliminary result)   Collection Time: 02/13/20  3:02 AM   Specimen: BLOOD  Result Value Ref Range Status   Specimen Description BLOOD BLOOD LEFT HAND  Final   Special Requests   Final    BOTTLES DRAWN AEROBIC AND ANAEROBIC Blood Culture adequate volume   Culture   Final    NO GROWTH 1 DAY Performed at [facility], [address], [place], [place] [address]    Report Status PENDING  Incomplete  Respiratory Panel by RT PCR (Flu A&B, Covid) - Nasopharyngeal Swab     Status: None   Collection Time: 02/13/20  3:03 AM   Specimen: Nasopharyngeal Swab  Result Value Ref Range Status   SARS Coronavirus 2 by RT PCR NEGATIVE NEGATIVE Final    Comment: (NOTE) SARS-CoV-2 target nucleic acids are NOT DETECTED.  The SARS-CoV-2 RNA is generally detectable in upper respiratoy specimens during the acute phase of infection. The lowest concentration of SARS-CoV-2 viral copies this assay can detect is 131 copies/mL. A negative result does not preclude SARS-Cov-2 infection and should not be used as the sole basis for treatment or other patient management decisions. A negative result may occur with  improper specimen collection/handling, submission of specimen other than nasopharyngeal swab, presence of viral mutation(s) within the areas targeted by this assay, and inadequate number of viral copies (<131 copies/mL). A negative result must be combined with clinical observations, patient history, and epidemiological information. The expected result is Negative.  Fact Sheet for Patients:  PinkCheek.be  Fact Sheet for Healthcare Providers:  GravelBags.it  This test is no t yet approved or cleared by the [place] FDA and  has been authorized for  detection and/or diagnosis of SARS-CoV-2 by FDA under an Emergency Use Authorization (EUA). This EUA will remain  in effect (meaning this test can be used) for the duration of the COVID-19 declaration under Section 564(b)(1) of the Act, 21 U.S.C. section 360bbb-3(b)(1), unless the authorization is terminated or revoked sooner.     Influenza A by PCR NEGATIVE NEGATIVE Final   Influenza B by PCR NEGATIVE NEGATIVE Final    Comment: (NOTE) The Xpert Xpress SARS-CoV-2/FLU/RSV assay is intended as an aid in  the diagnosis of influenza from Nasopharyngeal swab specimens and  should not be used as a sole basis for treatment. Nasal washings and  aspirates are unacceptable for Xpert Xpress SARS-CoV-2/FLU/RSV  testing.  Fact Sheet for Patients: PinkCheek.be  Fact Sheet for Healthcare Providers: GravelBags.it  This test is not yet approved or cleared by the [place] FDA and  has been authorized for detection and/or diagnosis of SARS-CoV-2 by  FDA under an Emergency Use Authorization (EUA). This EUA will remain  in effect (meaning this test can be used) for the duration of the  Covid-19 declaration under Section 564(b)(1) of the Act, 21  U.S.C. section 360bbb-3(b)(1), unless the authorization is  terminated or revoked. Performed at [facility], [address] [place], [place] [address]   Aerobic/Anaerobic Culture (surgical/deep wound)     Status: None (Preliminary result)   Collection Time: 02/13/20  1:06 PM   Specimen: PATH Other; Tissue  Result Value Ref Range Status   Specimen Description   Final    ABSCESS Performed at [facility], [address] [place], [place] [address]    Special Requests   Final    A right axillary abscess Performed at [facility], [place], [place] [address]    Gram Stain   Final    RARE WBC PRESENT, PREDOMINANTLY PMN RARE GRAM POSITIVE COCCI IN  PAIRS Performed at [facility], [place] [address], [place], [place] [address]    Culture PENDING  Incomplete   Report Status PENDING  Incomplete         Radiology Studies: No results found.      Scheduled Meds: . enoxaparin (  LOVENOX) injection  40 mg Subcutaneous Q24H  . nicotine  21 mg Transdermal Daily   Continuous Infusions: . sodium chloride Stopped (02/14/20 0335)  . cefTRIAXone (ROCEPHIN)  IV Stopped (02/14/20 0313)  . vancomycin    . vancomycin 1,000 mg (02/14/20 1109)     LOS: 1 day        Hosie Poisson, MD [facility]   To contact the attending provider between 7A-7P or the covering provider during after hours 7P-7A, please log into the web site [email or web address] and access using universal [place] password for that web site. If you do not have the password, please call the hospital operator.  02/14/2020, 12:21 PM

## 2020-02-14 NOTE — Progress Notes (Addendum)
Pharmacy Antibiotic Note  Maureen Underwood is a 44 y.o. female admitted on 02/13/2020 with sepsis.  Pharmacy has been consulted for Vancomycin  dosing.  Plan:. Will adjust vancomycin to 1000 mg Q12H - Expected AUC 535.7 & Cssmin 14.5 (Vd 0.5 and Scr 0.77)  Height: 5\' 6"  (167.6 cm) Weight: 95.3 kg (210 lb 1.6 oz) IBW/kg (Calculated) : 59.3  Temp (24hrs), Avg:97.9 F (36.6 C), Min:97.1 F (36.2 C), Max:98.5 F (36.9 C)  Recent Labs  Lab 02/10/20 1811 02/11/20 1518 02/12/20 2256 02/13/20 0302 02/13/20 0854  WBC 15.5* 15.7* 11.7*  --   --   CREATININE 1.10* 0.86 0.85  --  0.79  LATICACIDVEN  --   --  2.2* 1.5  --     Estimated Creatinine Clearance: 106.6 mL/min (by C-G formula based on SCr of 0.79 mg/dL).    Allergies  Allergen Reactions  . Penicillins Hives    Antimicrobials this admission: 10/6 Vancomycin >> 10/6 CTX >>  Microbiology results:  BCx: NGTD WCx: GOC in pairs  Thank you for allowing pharmacy to be a part of this patient's care.  Rowland Lathe 02/14/2020 10:20 AM

## 2020-02-15 LAB — CREATININE, SERUM
Creatinine, Ser: 0.88 mg/dL (ref 0.44–1.00)
GFR calc non Af Amer: >60 mL/min (ref >60)

## 2020-02-15 LAB — MRSA PCR SCREENING: MRSA by PCR: NEGATIVE

## 2020-02-15 MED ORDER — TRAMADOL HCL 50 MG PO TABS: 50.0000 mg | ORAL_TABLET | Freq: Four times a day (QID) | ORAL | 0 refills | Status: AC | PRN

## 2020-02-15 NOTE — Care Plan (Signed)
AVS given to patient at this time. Pt denies questions. IV removed. Oxycodone given for pain po at this time.

## 2020-02-16 NOTE — Discharge Summary (Signed)
Physician Discharge Summary  Maureen Underwood DXI:[id number] DOB: 12/31/1975 DOA: 02/13/2020  PCP: Patient, No Pcp Per  Admit date: 02/13/2020 Discharge date: 02/15/2020  Admitted From: Home.  Disposition:  Home.   Recommendations for Outpatient Follow-up:  1. Follow up with PCP in 1-2 weeks 2. Please obtain BMP/CBC in one week Please follow up with general surgery as recommended.   Discharge Condition:stable.  CODE STATUS:FULL CODE.  Diet recommendation: Heart Healthy   Brief/Interim Summary: 44 year old lady with prior history of GERD, depression presents to ED for possible abscess in cellulitis of the right axilla. She was initially seen on 02/12/2020 for the same complaints underwent I&D and discharged on clindamycin. Patient reports the swelling and pain in the right axilla has worsened. She was admitted to [facility] for evaluation and management of PE abscess and cellulitis of the right axilla. General surgery consulted and she  Underwent Incision and drainage of the right axillary abscess by general surgery on 02/13/2020.  Cultures from the I&D are pending currently show gram-positive cocci.  she was discharged on clindamycin to complete the course.   Discharge Diagnoses:  Principal Problem:   Cellulitis of right axilla Active Problems:   Depression   Irritable bowel syndrome with diarrhea   Sepsis ([place])  Sepsis ruled out. She had SIRS, with  tachycardia HR of 97/min, lactic acid of 2.2 , afebrile , normotensive, and wbc count of 11,700.    Cellulitis and abscess of the right axillary area Improving s/p I&D on 02/13/2020. Started on b road spectrum IV antibiotics and transition to oral meds on discharge.  Wound care as per general surgery. Saline moistened gauze/iodofrm, cover with dray gauze, secure with tape....this should be done daily. Cultures from I&  D pending and show gram positive cocci, further Identification and sensitivities are pending. She was discharged on oral  clindamycin and recommended to follow upw ith PCP for final culture report.     Tobacco abuse:  Cessation counseling given and nicotine patch ordered.    Hypokalemia:  Replaced.    Discharge Instructions  Discharge Instructions    Diet - low sodium heart healthy   Complete by: As directed    Discharge instructions   Complete by: As directed    Please follow up with Gen surgery or PCP for the final culture report.   Discharge wound care:   Complete by: As directed    Change Right arm packing. 1/2 inch packing daily/     Allergies as of 02/15/2020      Reactions   Penicillins Hives      Medication List    TAKE these medications   clindamycin 300 MG capsule Commonly known as: CLEOCIN Take 1 capsule (300 mg total) by mouth 4 (four) times daily for 10 days.   traMADol 50 MG tablet Commonly known as: Ultram Take 1 tablet (50 mg total) by mouth every 6 (six) hours as needed for up to 3 days.            Discharge Care Instructions  (From admission, onward)         Start     Ordered   02/15/20 0000  Discharge wound care:       Comments: Change Right arm packing. 1/2 inch packing daily/   02/15/20 1000          Follow-up Information    Tylene Fantasia, PA-C In 2 weeks.   Specialty: Physician Assistant Why: October 25 @ 10:15 am Contact information: [address]  [address] [place] [address] [phone]              Allergies  Allergen Reactions  . Penicillins Hives    Consultations:  General surgery.    Procedures/Studies:  No results found.    Subjective: No new complaints.   Discharge Exam: Vitals:   02/14/20 2307 02/15/20 0721  BP: 123/70 133/71  Pulse: 72 65  Resp: 16 16  Temp: 98.1 F (36.7 C) 97.7 F (36.5 C)  SpO2: 100% 94%   Vitals:   02/14/20 1152 02/14/20 1517 02/14/20 2307 02/15/20 0721  BP: 118/84 (!) 118/59 123/70 133/71  Pulse: 66 68 72 65  Resp: 16 16 16 16   Temp: 98.8 F (37.1 C) 98.7 F  (37.1 C) 98.1 F (36.7 C) 97.7 F (36.5 C)  TempSrc:    Oral  SpO2: 99% 98% 100% 94%  Weight:      Height:        General: Pt is alert, awake, not in acute distress Cardiovascular: RRR, S1/S2 +, no rubs, no gallops Respiratory: CTA bilaterally, no wheezing, no rhonchi Abdominal: Soft, NT, ND, bowel sounds + Extremities: no edema, no cyanosis    The results of significant diagnostics from this hospitalization (including imaging, microbiology, ancillary and laboratory) are listed below for reference.     Microbiology: Recent Results (from the past 240 hour(s))  Culture, blood (Routine x 2)     Status: None (Preliminary result)   Collection Time: 02/12/20 10:56 PM   Specimen: BLOOD  Result Value Ref Range Status   Specimen Description BLOOD LEFT HAND  Final   Special Requests   Final    BOTTLES DRAWN AEROBIC AND ANAEROBIC Blood Culture results may not be optimal due to an excessive volume of blood received in culture bottles   Culture   Final    NO GROWTH 3 DAYS Performed at [facility], [address] [place], [place] [address]    Report Status PENDING  Incomplete  Culture, blood (Routine x 2)     Status: None (Preliminary result)   Collection Time: 02/13/20  3:02 AM   Specimen: BLOOD  Result Value Ref Range Status   Specimen Description BLOOD BLOOD LEFT HAND  Final   Special Requests   Final    BOTTLES DRAWN AEROBIC AND ANAEROBIC Blood Culture adequate volume   Culture   Final    NO GROWTH 2 DAYS Performed at [facility], [address] [place],  [address]    Report Status PENDING  Incomplete  Respiratory Panel by RT PCR (Flu A&B, Covid) - Nasopharyngeal Swab     Status: None   Collection Time: 02/13/20  3:03 AM   Specimen: Nasopharyngeal Swab  Result Value Ref Range Status   SARS Coronavirus 2 by RT PCR NEGATIVE NEGATIVE Final    Comment: (NOTE) SARS-CoV-2 target nucleic acids are NOT DETECTED.  The SARS-CoV-2 RNA is generally  detectable in upper respiratoy specimens during the acute phase of infection. The lowest concentration of SARS-CoV-2 viral copies this assay can detect is 131 copies/mL. A negative result does not preclude SARS-Cov-2 infection and should not be used as the sole basis for treatment or other patient management decisions. A negative result may occur with  improper specimen collection/handling, submission of specimen other than nasopharyngeal swab, presence of viral mutation(s) within the areas targeted by this assay, and inadequate number of viral copies (<131 copies/mL). A negative result must be combined with clinical observations, patient history, and epidemiological information.  The expected result is Negative.  Fact Sheet for Patients:  PinkCheek.be  Fact Sheet for Healthcare Providers:  GravelBags.it  This test is no t yet approved or cleared by the [place] FDA and  has been authorized for detection and/or diagnosis of SARS-CoV-2 by FDA under an Emergency Use Authorization (EUA). This EUA will remain  in effect (meaning this test can be used) for the duration of the COVID-19 declaration under Section 564(b)(1) of the Act, 21 U.S.C. section 360bbb-3(b)(1), unless the authorization is terminated or revoked sooner.     Influenza A by PCR NEGATIVE NEGATIVE Final   Influenza B by PCR NEGATIVE NEGATIVE Final    Comment: (NOTE) The Xpert Xpress SARS-CoV-2/FLU/RSV assay is intended as an aid in  the diagnosis of influenza from Nasopharyngeal swab specimens and  should not be used as a sole basis for treatment. Nasal washings and  aspirates are unacceptable for Xpert Xpress SARS-CoV-2/FLU/RSV  testing.  Fact Sheet for Patients: PinkCheek.be  Fact Sheet for Healthcare Providers: GravelBags.it  This test is not yet approved or cleared by the [place] FDA and   has been authorized for detection and/or diagnosis of SARS-CoV-2 by  FDA under an Emergency Use Authorization (EUA). This EUA will remain  in effect (meaning this test can be used) for the duration of the  Covid-19 declaration under Section 564(b)(1) of the Act, 21  U.S.C. section 360bbb-3(b)(1), unless the authorization is  terminated or revoked. Performed at [facility], [address] [place], [place] [address]   Aerobic/Anaerobic Culture (surgical/deep wound)     Status: None (Preliminary result)   Collection Time: 02/13/20  1:06 PM   Specimen: PATH Other; Tissue  Result Value Ref Range Status   Specimen Description   Final    ABSCESS Performed at [facility], [address] [place], [place] [address]    Special Requests   Final    A right axillary abscess Performed at [facility], [place], [place] [address]    Gram Stain   Final    RARE WBC PRESENT, PREDOMINANTLY PMN RARE GRAM POSITIVE COCCI IN PAIRS Performed at [facility], [place] [address], [place], [place] [address]    Culture   Final    FEW STAPHYLOCOCCUS AUREUS SUSCEPTIBILITIES TO FOLLOW NO ANAEROBES ISOLATED; CULTURE IN PROGRESS FOR 5 DAYS    Report Status PENDING  Incomplete  MRSA PCR Screening     Status: None   Collection Time: 02/15/20 10:08 AM   Specimen: Nasopharyngeal  Result Value Ref Range Status   MRSA by PCR NEGATIVE NEGATIVE Final    Comment:        The GeneXpert MRSA Assay (FDA approved for NASAL specimens only), is one component of a comprehensive MRSA colonization surveillance program. It is not intended to diagnose MRSA infection nor to guide or monitor treatment for MRSA infections. Performed at [facility], [address] [place], [place] [address]      Labs: BNP (last 3 results) No results for input(s): BNP in the last 8760 hours. Basic Metabolic Panel: Recent Labs  Lab 02/10/20 1811 02/10/20 1811  02/11/20 1518 02/12/20 2256 02/13/20 0854 02/14/20 1243 02/15/20 0231  NA 136  --  134* 138 141  --   --   K 3.5  --  3.3* 3.0* 3.6  --   --   CL 101  --  100 107 109  --   --   CO2 24  --  24 21* 25  --   --   GLUCOSE 87  --  102* 132* 100*  --   --   BUN 11  --  9 11 7   --   --   CREATININE 1.10*   < > 0.86 0.85 0.79 0.77 0.88  CALCIUM 9.3  --  8.6* 8.5* 8.3*  --   --    < > = values in this interval not displayed.   Liver Function Tests: Recent Labs  Lab 02/10/20 1811 02/12/20 2256  AST 21 20  ALT 35 26  ALKPHOS 75 74  BILITOT 1.9* 0.6  PROT 7.0 6.9  ALBUMIN 3.8 3.4*   No results for input(s): LIPASE, AMYLASE in the last 168 hours. No results for input(s): AMMONIA in the last 168 hours. CBC: Recent Labs  Lab 02/10/20 1811 02/11/20 1518 02/12/20 2256  WBC 15.5* 15.7* 11.7*  NEUTROABS 11.3* 11.3* 8.0*  HGB 13.8 12.9 13.2  HCT 40.8 36.7 35.5*  MCV 93.8 92.2 89.6  PLT 230 208 259   Cardiac Enzymes: No results for input(s): CKTOTAL, CKMB, CKMBINDEX, TROPONINI in the last 168 hours. BNP: Invalid input(s): POCBNP CBG: No results for input(s): GLUCAP in the last 168 hours. D-Dimer No results for input(s): DDIMER in the last 72 hours. Hgb A1c No results for input(s): HGBA1C in the last 72 hours. Lipid Profile No results for input(s): CHOL, HDL, LDLCALC, TRIG, CHOLHDL, LDLDIRECT in the last 72 hours. Thyroid function studies No results for input(s): TSH, T4TOTAL, T3FREE, THYROIDAB in the last 72 hours.  Invalid input(s): FREET3 Anemia work up No results for input(s): VITAMINB12, FOLATE, FERRITIN, TIBC, IRON, RETICCTPCT in the last 72 hours. Urinalysis    Component Value Date/Time   COLORURINE STRAW (A) 02/13/2020 0403   APPEARANCEUR HAZY (A) 02/13/2020 0403   LABSPEC 1.001 (L) 02/13/2020 0403   PHURINE 6.0 02/13/2020 0403   GLUCOSEU NEGATIVE 02/13/2020 0403   HGBUR SMALL (A) 02/13/2020 0403   BILIRUBINUR NEGATIVE 02/13/2020 0403   KETONESUR NEGATIVE  02/13/2020 0403   PROTEINUR NEGATIVE 02/13/2020 0403   UROBILINOGEN 1.0 07/11/2008 1608   NITRITE NEGATIVE 02/13/2020 0403   LEUKOCYTESUR NEGATIVE 02/13/2020 0403   Sepsis Labs Invalid input(s): PROCALCITONIN,  WBC,  LACTICIDVEN Microbiology Recent Results (from the past 240 hour(s))  Culture, blood (Routine x 2)     Status: None (Preliminary result)   Collection Time: 02/12/20 10:56 PM   Specimen: BLOOD  Result Value Ref Range Status   Specimen Description BLOOD LEFT HAND  Final   Special Requests   Final    BOTTLES DRAWN AEROBIC AND ANAEROBIC Blood Culture results may not be optimal due to an excessive volume of blood received in culture bottles   Culture   Final    NO GROWTH 3 DAYS Performed at [facility], [address] [place], [place] [address]    Report Status PENDING  Incomplete  Culture, blood (Routine x 2)     Status: None (Preliminary result)   Collection Time: 02/13/20  3:02 AM   Specimen: BLOOD  Result Value Ref Range Status   Specimen Description BLOOD BLOOD LEFT HAND  Final   Special Requests   Final    BOTTLES DRAWN AEROBIC AND ANAEROBIC Blood Culture adequate volume   Culture   Final    NO GROWTH 2 DAYS Performed at [facility], [address] [place], [place] [address]    Report Status PENDING  Incomplete  Respiratory Panel by RT PCR (Flu A&B, Covid) -  Nasopharyngeal Swab     Status: None   Collection Time: 02/13/20  3:03 AM   Specimen: Nasopharyngeal Swab  Result Value Ref Range Status   SARS Coronavirus 2 by RT PCR NEGATIVE NEGATIVE Final    Comment: (NOTE) SARS-CoV-2 target nucleic acids are NOT DETECTED.  The SARS-CoV-2 RNA is generally detectable in upper respiratoy specimens during the acute phase of infection. The lowest concentration of SARS-CoV-2 viral copies this assay can detect is 131 copies/mL. A negative result does not preclude SARS-Cov-2 infection and should not be used as the sole basis for treatment or other  patient management decisions. A negative result may occur with  improper specimen collection/handling, submission of specimen other than nasopharyngeal swab, presence of viral mutation(s) within the areas targeted by this assay, and inadequate number of viral copies (<131 copies/mL). A negative result must be combined with clinical observations, patient history, and epidemiological information. The expected result is Negative.  Fact Sheet for Patients:  PinkCheek.be  Fact Sheet for Healthcare Providers:  GravelBags.it  This test is no t yet approved or cleared by the [place] FDA and  has been authorized for detection and/or diagnosis of SARS-CoV-2 by FDA under an Emergency Use Authorization (EUA). This EUA will remain  in effect (meaning this test can be used) for the duration of the COVID-19 declaration under Section 564(b)(1) of the Act, 21 U.S.C. section 360bbb-3(b)(1), unless the authorization is terminated or revoked sooner.     Influenza A by PCR NEGATIVE NEGATIVE Final   Influenza B by PCR NEGATIVE NEGATIVE Final    Comment: (NOTE) The Xpert Xpress SARS-CoV-2/FLU/RSV assay is intended as an aid in  the diagnosis of influenza from Nasopharyngeal swab specimens and  should not be used as a sole basis for treatment. Nasal washings and  aspirates are unacceptable for Xpert Xpress SARS-CoV-2/FLU/RSV  testing.  Fact Sheet for Patients: PinkCheek.be  Fact Sheet for Healthcare Providers: GravelBags.it  This test is not yet approved or cleared by the [place] FDA and  has been authorized for detection and/or diagnosis of SARS-CoV-2 by  FDA under an Emergency Use Authorization (EUA). This EUA will remain  in effect (meaning this test can be used) for the duration of the  Covid-19 declaration under Section 564(b)(1) of the Act, 21  U.S.C. section  360bbb-3(b)(1), unless the authorization is  terminated or revoked. Performed at [facility], [address] [place], [place] [address]   Aerobic/Anaerobic Culture (surgical/deep wound)     Status: None (Preliminary result)   Collection Time: 02/13/20  1:06 PM   Specimen: PATH Other; Tissue  Result Value Ref Range Status   Specimen Description   Final    ABSCESS Performed at [facility], [address] [place], [place] [address]    Special Requests   Final    A right axillary abscess Performed at [facility], [place], [place] [address]    Gram Stain   Final    RARE WBC PRESENT, PREDOMINANTLY PMN RARE GRAM POSITIVE COCCI IN PAIRS Performed at [facility], [place] [address], [place],  [address]    Culture   Final    FEW STAPHYLOCOCCUS AUREUS SUSCEPTIBILITIES TO FOLLOW NO ANAEROBES ISOLATED; CULTURE IN PROGRESS FOR 5 DAYS    Report Status PENDING  Incomplete  MRSA PCR Screening     Status: None   Collection Time: 02/15/20 10:08 AM   Specimen: Nasopharyngeal  Result Value Ref Range Status   MRSA  by PCR NEGATIVE NEGATIVE Final    Comment:        The GeneXpert MRSA Assay (FDA approved for NASAL specimens only), is one component of a comprehensive MRSA colonization surveillance program. It is not intended to diagnose MRSA infection nor to guide or monitor treatment for MRSA infections. Performed at [facility], [address] [place], [place] [address]      Time coordinating discharge: 32 minutes.   SIGNED:   Hosie Poisson, MD  [facility]

## 2020-02-17 LAB — CULTURE, BLOOD (ROUTINE X 2): Culture: NO GROWTH

## 2020-02-18 LAB — AEROBIC/ANAEROBIC CULTURE W GRAM STAIN (SURGICAL/DEEP WOUND)

## 2020-02-18 LAB — CULTURE, BLOOD (ROUTINE X 2)
Culture: NO GROWTH
Special Requests: ADEQUATE

## 2020-03-03 ENCOUNTER — Other Ambulatory Visit: Payer: Self-pay

## 2020-03-03 ENCOUNTER — Encounter: Payer: Self-pay | Admitting: Physician Assistant

## 2020-03-03 ENCOUNTER — Ambulatory Visit (INDEPENDENT_AMBULATORY_CARE_PROVIDER_SITE_OTHER): Payer: Self-pay | Admitting: Physician Assistant

## 2020-03-03 VITALS — BP 128/83 | HR 80 | Temp 98.3°F | Resp 12 | Ht 66.0 in | Wt 201.0 lb

## 2020-03-03 DIAGNOSIS — Z09 Encounter for follow-up examination after completed treatment for conditions other than malignant neoplasm: Secondary | ICD-10-CM

## 2020-03-03 DIAGNOSIS — L03111 Cellulitis of right axilla: Secondary | ICD-10-CM

## 2020-03-03 NOTE — Patient Instructions (Addendum)
Continue to place a gauze one the area for drainage as needed. Take ibuprofen or Tylenol for pain as needed.   Follow up as needed. Call the office if you have any questions or concerns.

## 2020-03-03 NOTE — Progress Notes (Signed)
[facility] POST-OP OFFICE VISIT  03/03/2020  HPI: Maureen Underwood is a 44 y.o. female 19 days s/p incision and drainage of complex abscess to right axilla with Dr Dahlia Byes.   She is overall doing well She has some soreness with over exertion, controlled reasonably with Ibuprofen No fever, chills Cx grew staph aureus which was sensitive to clindamycin. She has completed her course of this Continues with dressing changes BID No other complaints   Vital signs: BP 128/83   Pulse 80   Temp 98.3 F (36.8 C) (Oral)   Resp 12   Ht 5\' 6"  (1.676 m)   Wt 201 lb (91.2 kg)   SpO2 98%   BMI 32.44 kg/m    Physical Exam: Constitutional: Well appearing female, NAD Skin: Incision to her right axilla is approximately 3 x 1 x 0.5 cm, the wound bed is 100% graulation tissue, no erythema or drainage, no recurrence. She does also have a small indurated nodule in her left axilla which was present on previous admission and improved.   Assessment/Plan: This is a 44 y.o. female 19 days s/p incision and drainage of complex abscess to right axilla   - Continue dressing changes; reviewed these with her. Supplies provided  - No indication for further interventions or Abx  - Reviewed Cx results with her  - She would like to follow up prn for now. We will be happy to see her should more issues arise.   -- Edison Simon, PA-C [facility] 03/03/2020, 10:44 AM [phone] M-F: 7am - 4pm

## 2020-05-17 IMAGING — CT CT RENAL STONE PROTOCOL
2 of 4 series · 16 of 46 positions shown, 18 images · non-contrast
Comparison: 08/04/2016

CLINICAL DATA: Flank pain. Kidney stones suspected.

EXAM:
CT ABDOMEN AND PELVIS WITHOUT CONTRAST
TECHNIQUE: Multidetector CT imaging of the abdomen and pelvis was performed
following the standard protocol without IV contrast.

[Series 2: stone full standard · axial · 0.86mm/px · z∈[-1114,-674]mm · 13 of 96 slices shown, 15 images]
[im 4/96  soft-tissue]
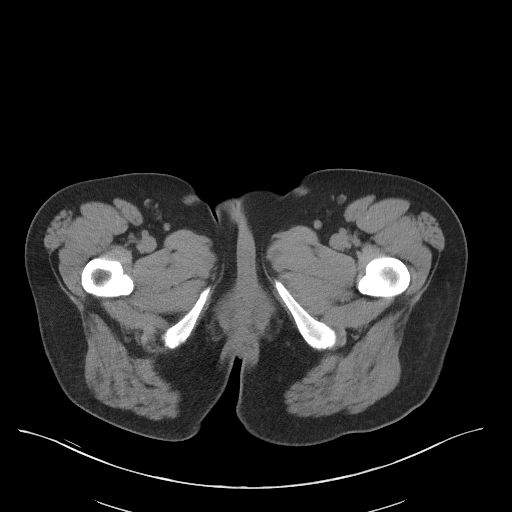
[im 4/96  bone]
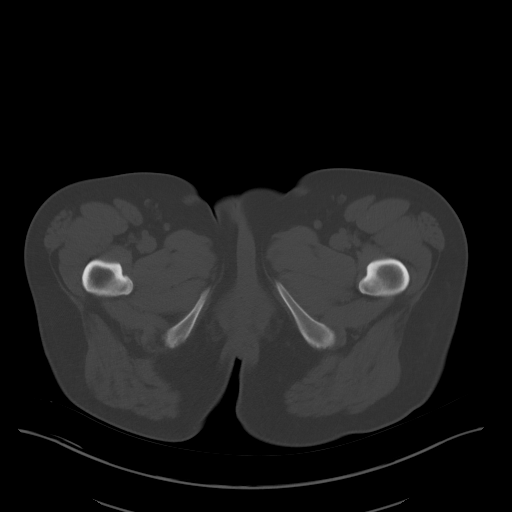
[im 12/96  soft-tissue]
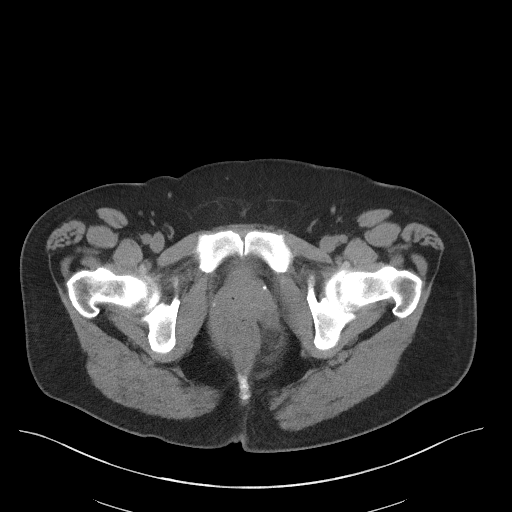
[im 20/96  soft-tissue]
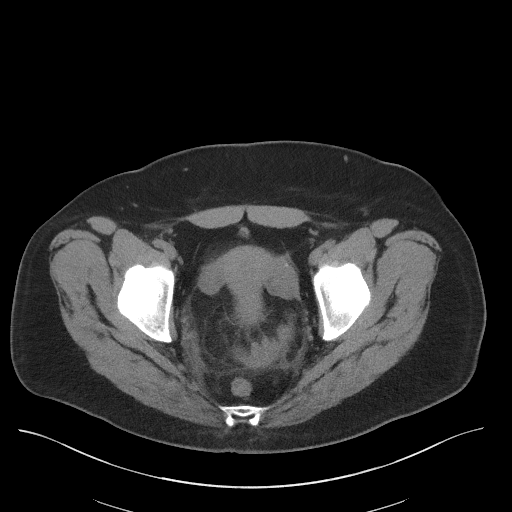
[im 27/96  soft-tissue]
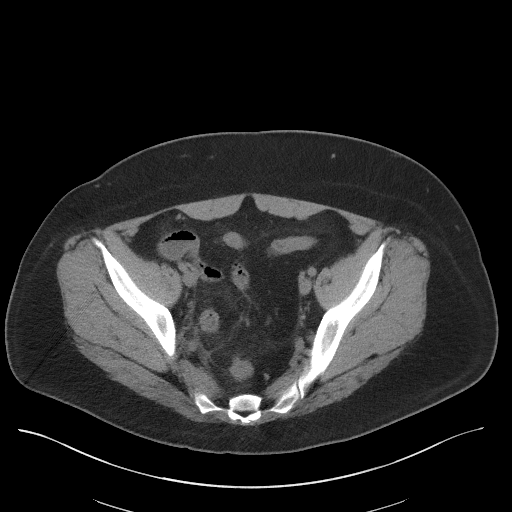
[im 35/96  soft-tissue]
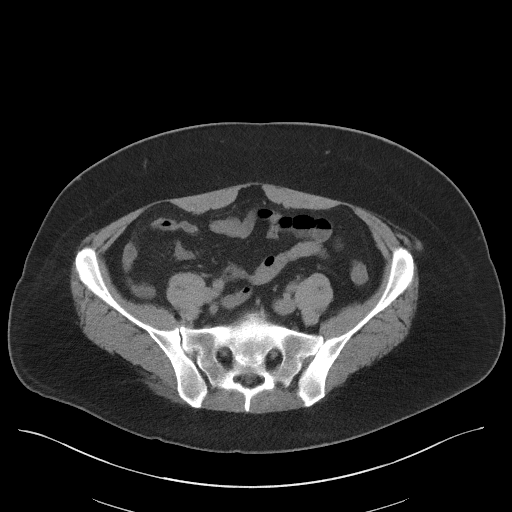
[im 42/96  soft-tissue]
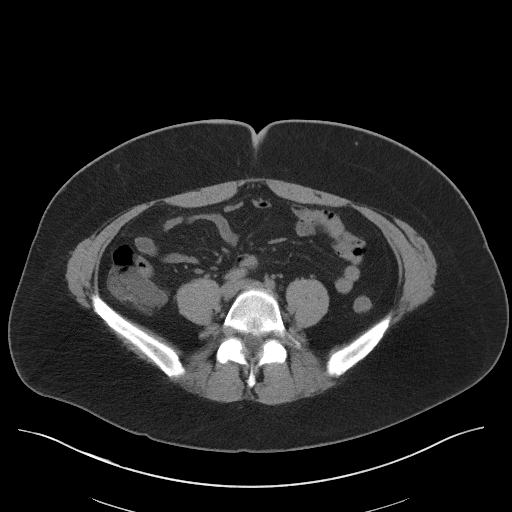
[im 50/96  soft-tissue]
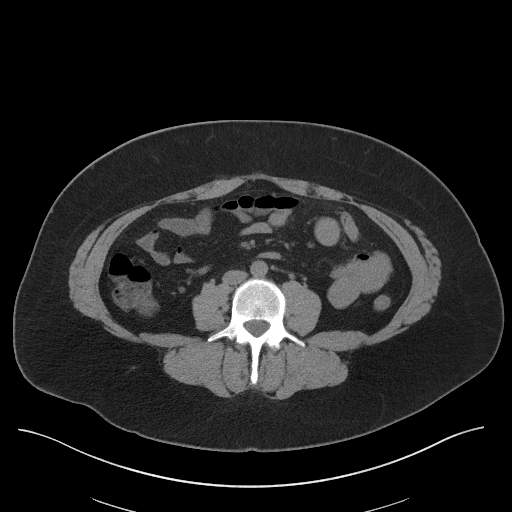
[im 54/96  soft-tissue]
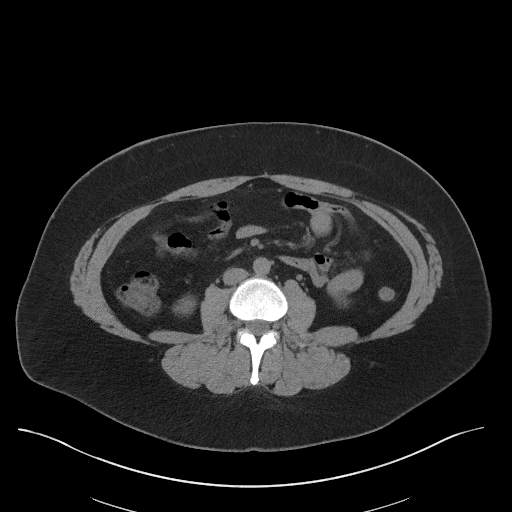
[im 61/96  soft-tissue]
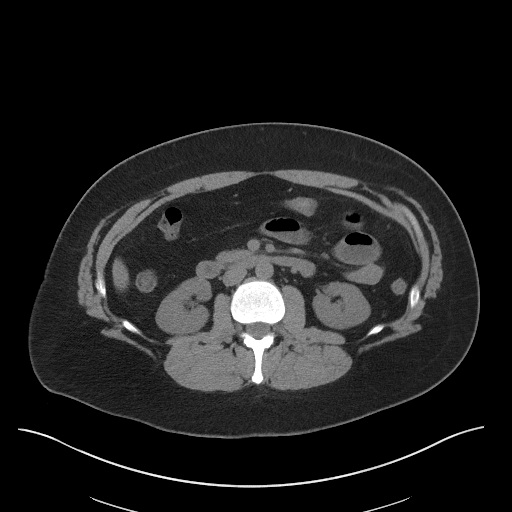
[im 61/96  bone]
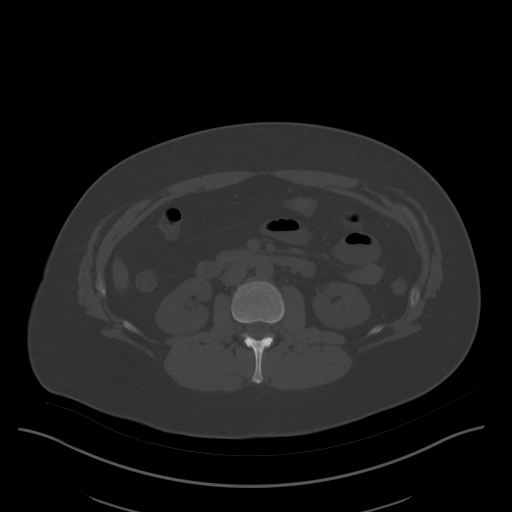
[im 69/96  soft-tissue]
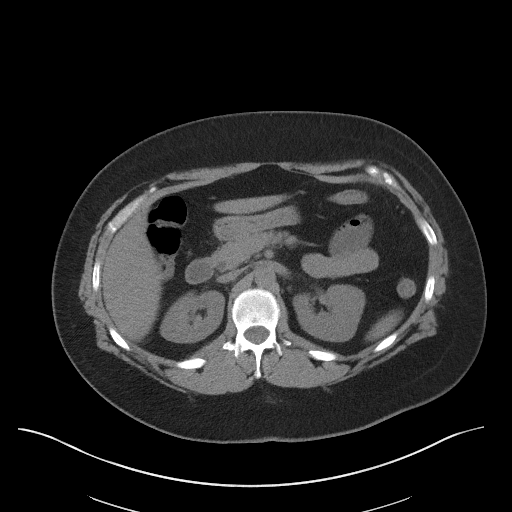
[im 77/96  soft-tissue]
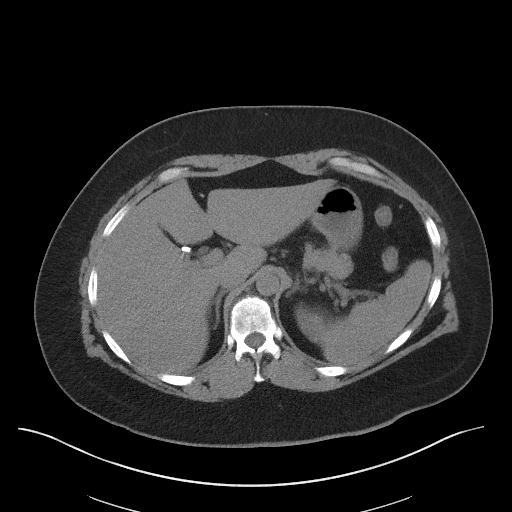
[im 84/96  soft-tissue]
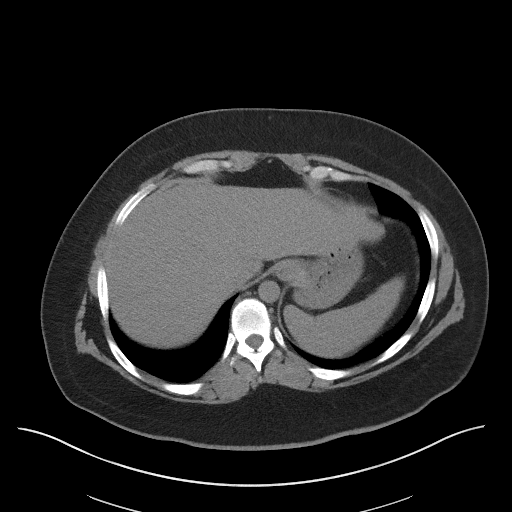
[im 92/96  soft-tissue]
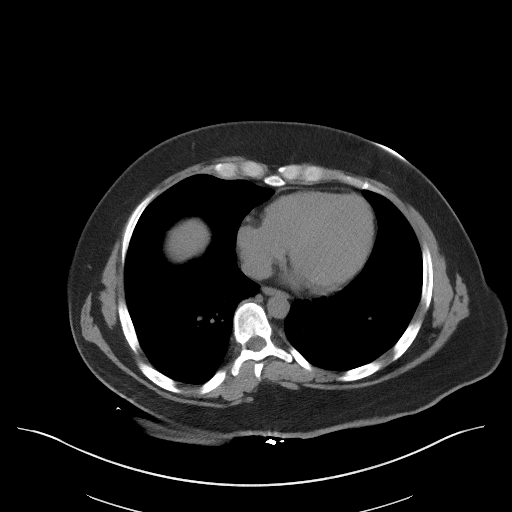

[Series 5: coronal · coronal · 0.84mm/px · 3 of 145 slices shown]
[im 49/145  soft-tissue]
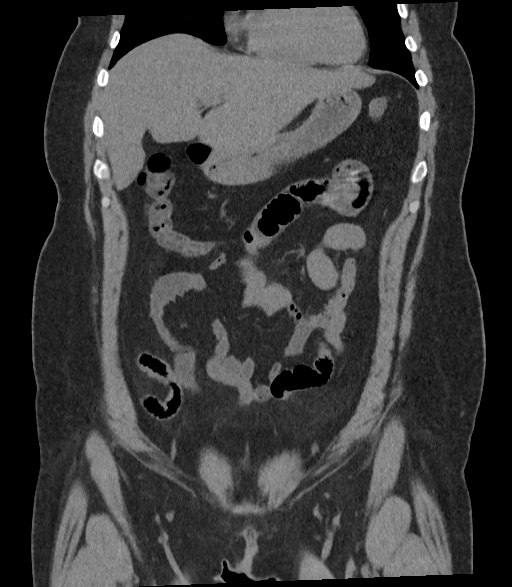
[im 65/145  soft-tissue]
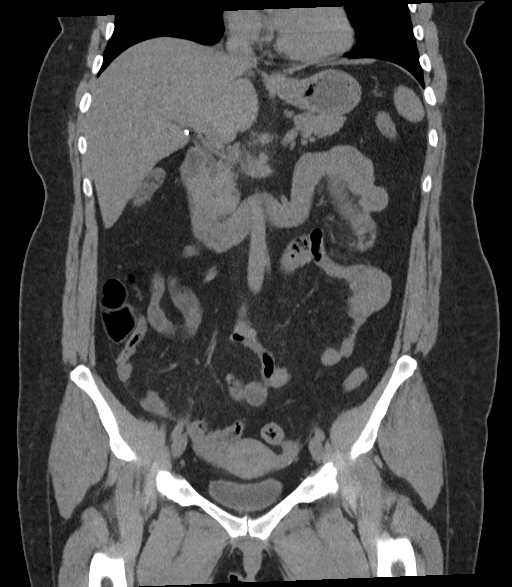
[im 81/145  soft-tissue]
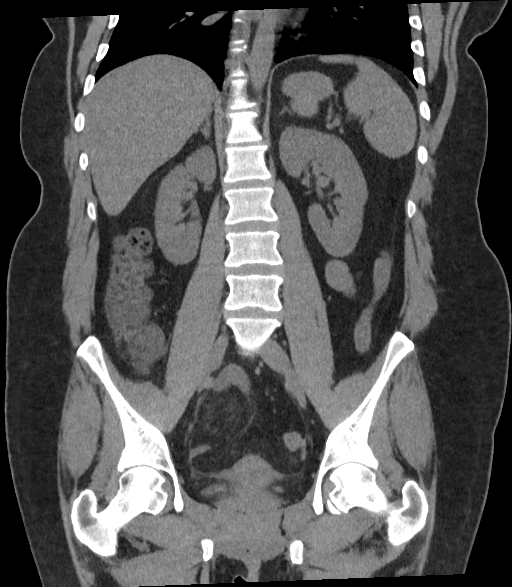

[16 of 46 positions shown; findings below may reference images not displayed]

FINDINGS: Lower chest: Unremarkable.

Hepatobiliary: The liver shows diffusely decreased attenuation
suggesting fat deposition. No focal abnormality in the liver on this
study without intravenous contrast. Gallbladder surgically absent.
No intrahepatic or extrahepatic biliary dilation.

Pancreas: No focal mass lesion. No dilatation of the main duct. No
intraparenchymal cyst. No peripancreatic edema.

Spleen: No splenomegaly. No focal mass lesion.

Adrenals/Urinary Tract: No adrenal nodule or mass. No stones are
seen in either kidney. No ureteral or bladder stones.

Stomach/Bowel: Stomach is unremarkable. No gastric wall thickening.
No evidence of outlet obstruction. Duodenum is normally positioned
as is the ligament of Treitz. No small bowel wall thickening. No
small bowel dilatation. The terminal ileum is normal. The appendix
is normal. There is edema/inflammation around the sigmoid colon with
probable mild circumferential wall thickening. No substantial
diverticular disease identified in the colon.

Vascular/Lymphatic: No abdominal aortic aneurysm. No abdominal
aortic atherosclerotic calcification. There is no gastrohepatic or
hepatoduodenal ligament lymphadenopathy. No retroperitoneal or
mesenteric lymphadenopathy. No pelvic sidewall lymphadenopathy.

Reproductive: The uterus is unremarkable.  There is no adnexal mass.

Other: No substantial intraperitoneal free fluid.

Musculoskeletal: No worrisome lytic or sclerotic osseous
abnormality.
IMPRESSION: 1. Mild edema/inflammation around the sigmoid colon with probable
mild circumferential wall thickening. Imaging features are most
suggestive of an infectious/inflammatory colitis although assessment
limited by lack of oral and intravenous contrast material. No
evidence for perforation or abscess. While this may be
diverticulitis, no inflamed diverticulum is evident and the colon is
largely free of diverticular disease.
2. No evidence for urinary stones.

## 2020-07-09 ENCOUNTER — Telehealth: Payer: Self-pay | Admitting: Surgery

## 2020-07-09 NOTE — Telephone Encounter (Signed)
Pt called after speaking w/the answering service earlier this morning before the office opened advising she has another 2 areas on her neck (both L & R side of neck; pea & golf ball size) that are very similar to the 2 spots that were excised in Oct by Dr. Dahlia Byes that ended up becoming septic. These areas are currently tender, hot to the touch (on fire), tingling but remain closed.  The R side presented 1 day ago & the L side presented approx 4 days ago but became inflamed/angry around the time the other appeared. Thedore Mins, [place] advised the pt to notify the office if she were to ever experience this in the future prior to going to the ED for evaluation. She is not currently taking medication, aside from Ibuprofen for pain.  Plz advise by calling back @ [phone].  Thank you

## 2020-07-09 NOTE — Telephone Encounter (Signed)
Spoke with the patient and she is unable to come in today to be seen as she does not have a ride. She will come in tomorrow morning to see Edison Simon PA-C.

## 2020-07-10 ENCOUNTER — Ambulatory Visit (INDEPENDENT_AMBULATORY_CARE_PROVIDER_SITE_OTHER): Payer: Self-pay | Admitting: Physician Assistant

## 2020-07-10 ENCOUNTER — Encounter: Payer: Self-pay | Admitting: Physician Assistant

## 2020-07-10 ENCOUNTER — Other Ambulatory Visit: Payer: Self-pay

## 2020-07-10 VITALS — BP 114/79 | HR 93 | Temp 98.1°F | Ht 66.0 in | Wt 208.0 lb

## 2020-07-10 DIAGNOSIS — L03211 Cellulitis of face: Secondary | ICD-10-CM

## 2020-07-10 MED ORDER — DOXYCYCLINE HYCLATE 100 MG PO TBEC: 100.0000 mg | DELAYED_RELEASE_TABLET | Freq: Two times a day (BID) | ORAL | 0 refills | Status: DC

## 2020-07-10 NOTE — Progress Notes (Signed)
[facility] SUR[facility] NOTE  07/10/2020  History of Present Illness: Maureen Underwood is a 45 y.o. female known to our service following admission in October 2021 for right axillary abscess which grew staph aureus (resistant to bactrim, cipro, and erythromycin) requiring I&D with Dr Dahlia Byes. She had done well following this but in the last few days she has noticed an area on her right neck and left jaw line which have become very painful, erythematous, and swollen which was concerning for her. She denied any fever, chills, cough, trouble swallowing, or additional areas of rash. She has been trying warm compresses at home without relief. No other symptoms. No other complaints.    Past Medical History: Past Medical History:  Diagnosis Date  . Anxiety   . Degenerative disc disease, lumbar   . Diarrhea   . Dyspepsia   . Fatty liver   . GERD (gastroesophageal reflux disease)   . IBS (irritable bowel syndrome)   . Personality disorder ([place])   . Polycystic ovarian disease   . Scoliosis    with associated back pain     Past Surgical History: Past Surgical History:  Procedure Laterality Date  . CERVICAL BIOPSY  W/ LOOP ELECTRODE EXCISION    . CHOLECYSTECTOMY    . DILATION AND CURETTAGE OF UTERUS     2005  . INCISION AND DRAINAGE ABSCESS Right 02/13/2020   Procedure: INCISION AND DRAINAGE ABSCESS;  Surgeon: Jules Husbands, MD;  Location: [facility] ORS;  Service: General;  Laterality: Right;  . INCISION AND DRAINAGE PERIRECTAL ABSCESS      Home Medications: Prior to Admission medications   Not on File    Allergies: Allergies  Allergen Reactions  . Penicillins Hives    Review of Systems: Review of Systems  Constitutional: Negative for chills and fever.  HENT: Negative for congestion and sore throat.   Respiratory: Negative for cough and shortness of breath.   Cardiovascular: Negative for chest pain and palpitations.  Gastrointestinal: Negative for abdominal pain,  nausea and vomiting.  Skin: Positive for rash.  All other systems reviewed and are negative.   Physical Exam BP 114/79   Pulse 93   Temp 98.1 F (36.7 C)   Ht 5\' 6"  (1.676 m)   Wt 208 lb (94.3 kg)   SpO2 98%   BMI 33.57 kg/m   Physical Exam Vitals and nursing note reviewed. Exam conducted with a chaperone present.  Constitutional:      General: She is not in acute distress.    Appearance: Normal appearance. She is not ill-appearing.  HENT:     Head: Normocephalic and atraumatic.   Cardiovascular:     Rate and Rhythm: Normal rate and regular rhythm.     Pulses: Normal pulses.  Pulmonary:     Effort: Pulmonary effort is normal. No respiratory distress.  Genitourinary:    Comments: Deferred Skin:    General: Skin is warm and dry.     Findings: Erythema present.     Comments: She does have multiple small excoriations on her arms bilaterally. Previous right axillary wound has healed well.   Neurological:     General: No focal deficit present.     Mental Status: She is alert and oriented to person, place, and time.  Psychiatric:        Mood and Affect: Mood normal.        Behavior: Behavior normal.     Labs/Imaging: No pertinent imaging studies available  Assessment and Plan: This  is a 45 y.o. female with right sided neck and left jaw line cellulitis without gross evidence of abscess, complicated by a history of staph aureus infections in the past   - I do not appreciate any gross fluctuance or purulence on examination that will require I&D at this time. Given her allergy history and previous Cx data, I will start her on 7 days of 100 mg Doxycyline BID. I encouraged her to continue to use warm compresses at home. Monitor for worsening symptoms. She will rtc on Monday for re-check and possible I&D if needed.   Face-to-face time spent with the patient and care providers was 20 minutes, with more than 50% of the time spent counseling, educating, and coordinating care of the  patient.     Edison Simon, PA-C [facility] 07/10/2020, 11:45 AM [phone] M-F: 7am - 4pm

## 2020-07-10 NOTE — Patient Instructions (Signed)
Keep using warm compresses several times a day. You can put Neosporin on the area if you would like.   We will start you on Doxycycline  We will see you back on Monday.

## 2020-07-14 ENCOUNTER — Telehealth: Payer: Self-pay | Admitting: Surgery

## 2020-07-14 ENCOUNTER — Other Ambulatory Visit: Payer: Self-pay

## 2020-07-14 ENCOUNTER — Ambulatory Visit: Payer: Medicaid Other | Admitting: Physician Assistant

## 2020-07-14 NOTE — Telephone Encounter (Signed)
Message left for the patient to call back. When I spoke with the pharmacy they did not have an insurance card on file for the patient. The only thing they had was a Good Rx card and that this was the price. Just need to see if the patient has insurance coverage or not to see what the next steps are.

## 2020-07-14 NOTE — Telephone Encounter (Signed)
Incoming call from the patient, had I & D abscess with Dr Dahlia Byes.  Zach prescribed her Doxycycline and when the patient went to her pharmacy to pick up even after insurance her part is $74.00.  She is asking is something else can be called in that would be a lot less and also asking for prescription to be sent to the [facility] in [place].  Thank you.

## 2020-07-15 NOTE — Telephone Encounter (Signed)
A second message has been left for the patient to call us back.

## 2020-11-21 ENCOUNTER — Ambulatory Visit: Payer: Self-pay

## 2020-11-21 ENCOUNTER — Ambulatory Visit
Admission: RE | Admit: 2020-11-21 | Discharge: 2020-11-21 | Disposition: A | Discharge status: Home / Self Care | Payer: Self-pay | Source: Ambulatory Visit

## 2020-11-21 ENCOUNTER — Other Ambulatory Visit: Payer: Self-pay

## 2020-11-21 VITALS — BP 136/81 | HR 95 | Temp 99.0°F | Resp 18

## 2020-11-21 DIAGNOSIS — L0201 Cutaneous abscess of face: Secondary | ICD-10-CM

## 2020-11-21 DIAGNOSIS — Z8619 Personal history of other infectious and parasitic diseases: Secondary | ICD-10-CM

## 2020-11-21 DIAGNOSIS — L02212 Cutaneous abscess of back [any part, except buttock]: Secondary | ICD-10-CM

## 2020-11-21 MED ORDER — DOXYCYCLINE HYCLATE 100 MG PO CAPS: 100.0000 mg | ORAL_CAPSULE | Freq: Two times a day (BID) | ORAL | 0 refills | Status: DC

## 2020-11-21 MED ORDER — NAPROXEN 500 MG PO TABS: 500.0000 mg | ORAL_TABLET | Freq: Two times a day (BID) | ORAL | 0 refills | Status: AC

## 2020-11-21 NOTE — ED Triage Notes (Signed)
Ongoing lesions on right arm since march 2022. Has been using alcohol, peroxide and antibiotic cream without resolve.  Three day h/o abscess on right cheek. Pt notes abscess is draining with yellow and slight pink fluid. Abscess is causing significant facial swelling.  One week h/o abscess on upper back that has opened but no drainage. Pt notes black "seed" like extractions from the wound.  Has been taking ibuprofen and using warm compresses without resolve. Two days of nausea. Pt has been taking zofran with some relief.

## 2020-11-21 NOTE — ED Provider Notes (Signed)
[place]   MRN: [id number] DOB: 01-28-1976  Subjective:   Maureen Underwood is a 45 y.o. female presenting for 3-day history of recurrent abscess.  She has one over the right cheek which she tried to lance and has been trying to drain herself.  She was successful with an abscess of the upper back.  Has been doing wound care herself at home.  Has a history of severe infections, sepsis warranting hospitalization.  Denies feeling like she did then.  Denies chest pain, nausea, vomiting, fever, dizziness, confusion, abdominal pain.  No current facility-administered medications for this encounter. No current outpatient medications on file.   Allergies  Allergen Reactions   Penicillins Hives    Past Medical History:  Diagnosis Date   Anxiety    Degenerative disc disease, lumbar    Diarrhea    Dyspepsia    Fatty liver    GERD (gastroesophageal reflux disease)    IBS (irritable bowel syndrome)    Personality disorder ([place])    Polycystic ovarian disease    Scoliosis    with associated back pain     Past Surgical History:  Procedure Laterality Date   CERVICAL BIOPSY  W/ LOOP ELECTRODE EXCISION     CHOLECYSTECTOMY     DILATION AND CURETTAGE OF UTERUS     2005   INCISION AND DRAINAGE ABSCESS Right 02/13/2020   Procedure: INCISION AND DRAINAGE ABSCESS;  Surgeon: Jules Husbands, MD;  Location: [facility] ORS;  Service: General;  Laterality: Right;   INCISION AND DRAINAGE PERIRECTAL ABSCESS      Family History  Problem Relation Age of Onset   Diabetes Mother    Diabetes Maternal Grandmother    Hyperlipidemia Maternal Grandmother    Heart disease Maternal Grandfather    Hypertension Maternal Grandfather    Lung cancer Maternal Grandfather    Pancreatic cancer Maternal Grandfather    Colon cancer Neg Hx    Esophageal cancer Neg Hx    Stomach cancer Neg Hx    Liver disease Neg Hx     Social History   Tobacco Use   Smoking status: Every Day    Packs/day: 0.25     Types: Cigarettes    Start date: 01/06/1997   Smokeless tobacco: Never   Tobacco comments:    smoked since 20; form given 03-01-16  Vaping Use   Vaping Use: Never used  Substance Use Topics   Alcohol use: No    Alcohol/week: 0.0 standard drinks   Drug use: No    ROS   Objective:   Vitals: BP 136/81 (BP Location: Left Arm)   Pulse 95   Temp 99 F (37.2 C) (Oral)   Resp 18   SpO2 98%   Physical Exam Constitutional:      General: She is not in acute distress.    Appearance: Normal appearance. She is well-developed. She is not ill-appearing, toxic-appearing or diaphoretic.  HENT:     Head: Normocephalic and atraumatic.      Nose: Nose normal.     Mouth/Throat:     Mouth: Mucous membranes are moist.     Pharynx: Oropharynx is clear.  Eyes:     General: No scleral icterus.    Extraocular Movements: Extraocular movements intact.     Pupils: Pupils are equal, round, and reactive to light.  Cardiovascular:     Rate and Rhythm: Normal rate.  Pulmonary:     Effort: Pulmonary effort is normal.  Skin:    General: Skin  is warm and dry.       Neurological:     General: No focal deficit present.     Mental Status: She is alert and oriented to person, place, and time.  Psychiatric:        Mood and Affect: Mood normal.        Behavior: Behavior normal.        PROCEDURE NOTE: I&D of Abscess Verbal consent obtained. Local anesthesia with 1cc of 2% lidocaine with epinephrine. Site cleansed with Betadine. Incision of <1/2cm was made using an 11 blade to reopen wound, <1cc expressed consisting of a mixture of pus and serosanguinous fluid. Wound cavity was explored with curved hemostats and loculations loosened. Cleansed and dressed.   Assessment and Plan :   PDMP not reviewed this encounter.  1. Facial abscess   2. Back abscess   3. History of sepsis     Patient is high risk given her history of sepsis.  At this point I do not see signs of that, performed successful  incision and drainage.  Start doxycycline, use naproxen for pain control.  Maintain strict ER precautions.  Follow-up with wound care, referral placed. Counseled patient on potential for adverse effects with medications prescribed today, patient verbalized understanding.    Jaynee Eagles, [place] 11/21/20 1909

## 2020-11-21 NOTE — Discharge Instructions (Addendum)
Please change your dressing 3-5 times daily. Do not apply any ointments or creams. Each time you change your dressing, make sure that you are pressing on the wound to get pus to come out.  Try your best to have a family member help you clean gently around the perimeter of the wound with gentle soap and warm water. Pat your wound dry and let it air out if possible to make sure it is dry before reapplying another dressing.

## 2020-12-10 ENCOUNTER — Other Ambulatory Visit: Payer: Self-pay

## 2020-12-10 ENCOUNTER — Encounter (HOSPITAL_BASED_OUTPATIENT_CLINIC_OR_DEPARTMENT_OTHER): Payer: Medicaid Other | Attending: Physician Assistant | Admitting: Physician Assistant

## 2020-12-10 DIAGNOSIS — Z09 Encounter for follow-up examination after completed treatment for conditions other than malignant neoplasm: Secondary | ICD-10-CM | POA: Insufficient documentation

## 2020-12-10 DIAGNOSIS — F17200 Nicotine dependence, unspecified, uncomplicated: Secondary | ICD-10-CM | POA: Insufficient documentation

## 2020-12-10 DIAGNOSIS — Z880 Allergy status to penicillin: Secondary | ICD-10-CM | POA: Insufficient documentation

## 2020-12-10 DIAGNOSIS — Z872 Personal history of diseases of the skin and subcutaneous tissue: Secondary | ICD-10-CM | POA: Insufficient documentation

## 2020-12-10 DIAGNOSIS — Z8614 Personal history of Methicillin resistant Staphylococcus aureus infection: Secondary | ICD-10-CM | POA: Insufficient documentation

## 2020-12-10 NOTE — Progress Notes (Signed)
VIDYA, KRUPSKI ([id number]) Visit Report for 12/10/2020 Chief Complaint Document Details Patient Name: Date of Service: Maureen Fix MA NDA J. 12/10/2020 9:00 [place] Record Number: [id number] Patient Account Number: [id number] Date of Birth/Sex: Treating RN: [birth year] (45 y.o. [name] Primary Care Provider: PA Haig [place], [place] Other Clinician: Referring Provider: Treating Provider/Extender: Vickii Penna in Treatment: 0 Information Obtained from: Patient Chief Complaint History of frequent abscesses but no open areas currently Electronic Signature(s) Signed: 12/10/2020 10:13:51 AM By: Worthy Keeler PA-C Entered By: Worthy Keeler on 12/10/2020 10:13:51 -------------------------------------------------------------------------------- HPI Details Patient Name: Date of Service: Maureen Fix MA NDA J. 12/10/2020 9:00 [place] Record Number: [id number] Patient Account Number: [id number] Date of Birth/Sex: Treating RN: Nov 30, 1975 (45 y.o. [name] Primary Care Provider: PA Haig [place], [place] Other Clinician: Referring Provider: Treating Provider/Extender: Vickii Penna in Treatment: 0 History of Present Illness HPI Description: 12/10/2020 upon evaluation today patient appears to be doing somewhat poorly in regard to abscesses that she has over her body pretty much in general. Some of these get to be fairly extensive she is actually been in the hospital October 2021 she was septic due to an abscess under her axillary region. Fortunately most of them are not that bad. July 25 she did go to urgent care where she had 1 on her cheek as well as her back lanced. She also had an area on her arm which was not present but again there does not appear to be any signs of anything open at this point. 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 Unfortunately she is having quite a tough time as she has been laid off at work due to medical issues they will not allow her to return her husband has been laid off as he is actually a arborist and his company that he worked for previously shut down due to [employer]. Nonetheless he has had trouble finding a job as there is a lot of arborist looking for jobs and not Therapist, nutritional due to something having shut down. This is obviously a very difficult situation in general. Electronic Signature(s) Signed: 12/10/2020 5:17:16 PM By: Worthy Keeler PA-C Entered By: Worthy Keeler on 12/10/2020 17:17:16 -------------------------------------------------------------------------------- Physical Exam Details Patient Name: Date of Service: Maureen Fix MA NDA J. 12/10/2020 9:00 [place] Record Number: [id number] Patient Account Number: [id number] Date of Birth/Sex: Treating RN: [birth year] (45 y.o. [name] Primary Care Provider: PA Haig [place], [place] Other Clinician: Referring Provider: Treating Provider/Extender: Vickii Penna in Treatment: 0 Constitutional sitting or standing blood pressure is within target range for patient.. pulse regular and within target range for patient.Marland Kitchen respirations regular, non-labored and within target range for patient.Marland Kitchen temperature within target range for patient.. Well-nourished and well-hydrated in no acute distress. Eyes conjunctiva clear no eyelid edema noted. pupils equal round and reactive to light and accommodation. Ears, Nose, Mouth, and Throat no gross abnormality of ear auricles or external auditory canals. normal hearing noted during conversation. mucus membranes moist. Respiratory normal breathing without difficulty. Cardiovascular 2+ dorsalis pedis/posterior tibialis pulses. no clubbing, cyanosis, significant edema, <3 sec cap refill. Musculoskeletal normal gait and posture. no significant deformity or  arthritic changes, no loss or range of motion, no clubbing. Psychiatric this  patient is able to make decisions and demonstrates good insight into disease process. Alert and Oriented x 3. pleasant and cooperative. Notes Upon inspection patient's wound locations actually appear to be pretty much healed I do not see anything open and obvious at this point. With that being said I do think that she is having issues with ongoing and frequent abscesses to varying degrees and obviously this is definitely an issue that is getting continue to be a problem for her as well. Electronic Signature(s) Signed: 12/10/2020 5:17:44 PM By: Worthy Keeler PA-C Entered By: Worthy Keeler on 12/10/2020 17:17:43 -------------------------------------------------------------------------------- Physician Orders Details Patient Name: Date of Service: Maureen Fix MA NDA J. 12/10/2020 9:00 [place] Record Number: [id number] Patient Account Number: [id number] Date of Birth/Sex: Treating RN: 01/18/1976 (45 y.o. [name] Primary Care Provider: PA Haig [place], [place] Other Clinician: Referring Provider: Treating Provider/Extender: Vickii Penna in Treatment: 0 Verbal / Phone Orders: No Diagnosis Coding ICD-10 Coding Code Description Z87.2 Personal history of diseases of the skin and subcutaneous tissue Discharge From [facility] Services Discharge from [place] Bathing/ Shower/ Hygiene Other Bathing/Shower/Hygiene Orders/Instructions: - daily shower with hibiclens daily for 1 week then weekly for another 4 weeks Additional Orders / Instructions Other: - start taking antibiotic as prescribed until gone for 1 month Non Wound Condition Other Non Wound Condition Orders/Instructions: - mupirocin ointment under fingernails and in nose opening daily for 1 week then weekly for the next 4 weeks Patient Medications llergies: penicillin A Notifications Medication Indication Start End 12/10/2020 Bactrim  DS DOSE 1 - oral 800 mg-160 mg tablet - 1 tablet oral 2 times per day for 30 days 12/10/2020 mupirocin DOSE topical 2 % ointment - ointment topical use under fingernails and nares daily for 7 days then weekly for 4 weeks Electronic Signature(s) Signed: 12/10/2020 5:22:07 PM By: Worthy Keeler PA-C Signed: 12/10/2020 5:57:23 PM By: Baruch Gouty RN, BSN Entered By: Baruch Gouty on 12/10/2020 10:38:45 Prescription 12/10/2020 -------------------------------------------------------------------------------- Veatrice Bourbon PA Patient Name: Provider: [birth year] [id number] Date of Birth: NPI#: [id number] Sex: DEA #: [id number] Phone #: License #: [place] Patient Address: [place] [address] [place] [address] , [place], [place] [address] [phone] Allergies penicillin Medication Medication: Route: Strength: Form: Bactrim DS oral 800 mg-160 mg tablet Class: ABSORBABLE SULFONAMIDE ANTIBACTERIAL AGENTS Dose: Frequency / Time: Indication: 1 1 tablet oral 2 times per day for 30 days Number of Refills: Number of Units: 2 Sixty (60) Tablet(s) Generic Substitution: Start Date: End Date: Administered at Facility: Substitution Permitted [id number] No Note to Pharmacy: Hand Signature: Date(s): Prescription 12/10/2020 Veatrice Bourbon PA Patient Name: Provider: [birth year] [id number] Date of Birth: NPI#: [id number] Sex: DEA #: [id number] Phone #: License #: [place] Patient Address: [place] [address] [place] [address] , [place],  [address] [phone] Allergies penicillin Medication Medication: Route: Strength: Form: mupirocin topical 2% ointment Class: TOPICAL ANTIBIOTICS Dose: Frequency / Time: Indication: ointment topical use under fingernails and nares daily for 7  days then weekly for 4 weeks Number of Refills: Number of Units: 2 Twenty Two (22) Gram(s) Generic Substitution: Start Date: End Date: Administered at Facility: Substitution Permitted [id number] No Note to Pharmacy: Hand Signature: Date(s): Electronic Signature(s) Signed: 12/10/2020 5:22:07 PM By: Worthy Keeler PA-C  Signed: 12/10/2020 5:57:23 PM By: Baruch Gouty RN, BSN Entered By: Baruch Gouty on 12/10/2020 10:38:46 -------------------------------------------------------------------------------- Problem List Details Patient Name: Date of Service: Maureen Fix MA NDA J. 12/10/2020 9:00 [place] [facility] Record Number: [id number] Patient Account Number: [id number] Date of Birth/Sex: Treating RN: 1975-09-13 (45 y.o. [name] Primary Care Provider: PA Haig [place], [place] Other Clinician: Referring Provider: Treating Provider/Extender: Vickii Penna in Treatment: 0 Active Problems ICD-10 Encounter Code Description Active Date MDM Diagnosis Z87.2 Personal history of diseases of the skin and subcutaneous tissue 12/10/2020 No Yes Inactive Problems Resolved Problems Electronic Signature(s) Signed: 12/10/2020 10:11:57 AM By: Worthy Keeler PA-C Entered By: Worthy Keeler on 12/10/2020 10:11:56 -------------------------------------------------------------------------------- Progress Note Details Patient Name: Date of Service: Maureen Fix MA NDA J. 12/10/2020 9:00 [place] Record Number: [id number] Patient Account Number: [id number] Date of Birth/Sex: Treating RN: 11-Oct-1975 (45 y.o. [name] Primary Care Provider: PA Haig [place], [place] Other Clinician: Referring Provider: Treating Provider/Extender: Vickii Penna in Treatment: 0 Subjective Chief Complaint Information obtained from Patient History of frequent abscesses but no open areas currently History of Present Illness (HPI) 12/10/2020 upon evaluation today patient appears to be doing  somewhat poorly in regard to abscesses that she has over her body pretty much in general. Some of these get to be fairly extensive she is actually been in the hospital October 2021 she was septic due to an abscess under her axillary region. Fortunately most of them are not that bad. July 25 she did go to urgent care where she had 1 on her cheek as well as her back lanced. She also had an area on her arm which was not present but again there does not appear to be any signs of anything open at this point. 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 Unfortunately she is having quite a tough time as she has been laid off at work due to medical issues they will not allow her to return her husband has been laid off as he is actually a arborist and his company that he worked for previously shut down due to [employer]. Nonetheless he has had trouble finding a job as there is a lot of arborist looking for jobs and not Therapist, nutritional due to something having shut down. This is obviously a very difficult situation in general. Patient History Information obtained from Patient. Allergies penicillin (Severity: Moderate, Reaction: Hives, Vomiting) Family History Cancer - Maternal Grandparents, Diabetes - Maternal Grandparents, Heart Disease - Maternal Grandparents, Hypertension - Maternal Grandparents, Stroke - Paternal Grandparents, No family history of Hereditary Spherocytosis, Seizures, Thyroid Problems, Tuberculosis. Social History Current every day smoker, Marital Status - Married, Alcohol Use - Never, Drug Use - No History, Caffeine Use - Moderate. Medical History Ear/Nose/Mouth/Throat Patient has history of Chronic sinus problems/congestion Musculoskeletal Patient has history of Osteoarthritis - DDD Medical A Surgical History  Notes nd Gastrointestinal IBS-GERD Oncologic Cervical Pre-Cancerous Review of Systems (ROS) Eyes Complains or has symptoms of Glasses / Contacts. Ear/Nose/Mouth/Throat Complains or has symptoms of Chronic sinus problems or rhinitis. Respiratory Denies complaints or symptoms of Chronic or frequent coughs, Shortness of Breath. Cardiovascular Denies complaints or symptoms of Chest pain. Endocrine Denies complaints or symptoms of Heat/cold intolerance. Genitourinary Denies complaints or symptoms of Frequent urination. Integumentary (Skin) Complains or  has symptoms of Wounds. Neurologic Denies complaints or symptoms of Numbness/parasthesias. Psychiatric Denies complaints or symptoms of Claustrophobia, Suicidal. Objective Constitutional sitting or standing blood pressure is within target range for patient.. pulse regular and within target range for patient.Marland Kitchen respirations regular, non-labored and within target range for patient.Marland Kitchen temperature within target range for patient.. Well-nourished and well-hydrated in no acute distress. Vitals Time Taken: 9:25 AM, Height: 66 in, Source: Stated, Weight: 210 lbs, Source: Stated, BMI: 33.9, Temperature: 98.4 F, Pulse: 80 bpm, Respiratory Rate: 16 breaths/min, Blood Pressure: 137/84 mmHg. Eyes conjunctiva clear no eyelid edema noted. pupils equal round and reactive to light and accommodation. Ears, Nose, Mouth, and Throat no gross abnormality of ear auricles or external auditory canals. normal hearing noted during conversation. mucus membranes moist. Respiratory normal breathing without difficulty. Cardiovascular 2+ dorsalis pedis/posterior tibialis pulses. no clubbing, cyanosis, significant edema, Musculoskeletal normal gait and posture. no significant deformity or arthritic changes, no loss or range of motion, no clubbing. Psychiatric this patient is able to make decisions and demonstrates good insight into disease process. Alert and  Oriented x 3. pleasant and cooperative. General Notes: Upon inspection patient's wound locations actually appear to be pretty much healed I do not see anything open and obvious at this point. With that being said I do think that she is having issues with ongoing and frequent abscesses to varying degrees and obviously this is definitely an issue that is getting continue to be a problem for her as well. Assessment Active Problems ICD-10 Personal history of diseases of the skin and subcutaneous tissue Plan Discharge From [facility] Services: Discharge from [place] Bathing/ Shower/ Hygiene: Other Bathing/Shower/Hygiene Orders/Instructions: - daily shower with hibiclens daily for 1 week then weekly for another 4 weeks Additional Orders / Instructions: Other: - start taking antibiotic as prescribed until gone for 1 month Non Wound Condition: Other Non Wound Condition Orders/Instructions: - mupirocin ointment under fingernails and in nose opening daily for 1 week then weekly for the next 4 weeks The following medication(s) was prescribed: Bactrim DS oral 800 mg-160 mg tablet 1 1 tablet oral 2 times per day for 30 days starting 12/10/2020 mupirocin topical 2 % ointment ointment topical use under fingernails and nares daily for 7 days then weekly for 4 weeks starting 12/10/2020 1. I do believe that the patient would benefit from decolonization for MRSA with the treatment including Hibiclens wash daily as well as mupirocin underneath the nails and in the nostrils daily at night for 1 week and then weekly for 4 weeks. I think both her and her husband need to do this also think she needs to not use a towel more than once when she uses it and needs to be thrown into the Vashe hot water cycle. 2. I am also can recommend Bactrim DS for her she tells me in the past she is done very well with this I think that is an appropriate medication I did give her that as well as a prescription for the mupirocin and I  explained to her that I do believe she can ask around and check around as far as prices are concerned I would also check with [facility] to find the cheapest price to see what they can do in that regard. I know right now money is very tight with her and her husband both being out of work they are on the verge of being evicted as well. Number to see the patient back for a follow-up visit as needed if  anything changes or worsens she should contact the office and let me know otherwise we will see where things stand in the future just as needed. Electronic Signature(s) Signed: 12/10/2020 5:18:59 PM By: Worthy Keeler PA-C Entered By: Worthy Keeler on 12/10/2020 17:18:59 -------------------------------------------------------------------------------- HxROS Details Patient Name: Date of Service: Maureen Fix MA NDA J. 12/10/2020 9:00 [place] Record Number: [id number] Patient Account Number: [id number] Date of Birth/Sex: Treating RN: 1976-02-23 (45 y.o. Sue Lush Primary Care Provider: PA Haig [place], [place] Other Clinician: Referring Provider: Treating Provider/Extender: Vickii Penna in Treatment: 0 Information Obtained From Patient Eyes Complaints and Symptoms: Positive for: Glasses / Contacts Ear/Nose/Mouth/Throat Complaints and Symptoms: Positive for: Chronic sinus problems or rhinitis Medical History: Positive for: Chronic sinus problems/congestion Respiratory Complaints and Symptoms: Negative for: Chronic or frequent coughs; Shortness of Breath Cardiovascular Complaints and Symptoms: Negative for: Chest pain Endocrine Complaints and Symptoms: Negative for: Heat/cold intolerance Genitourinary Complaints and Symptoms: Negative for: Frequent urination Integumentary (Skin) Complaints and Symptoms: Positive for: Wounds Neurologic Complaints and Symptoms: Negative for: Numbness/parasthesias Psychiatric Complaints and Symptoms: Negative for: Claustrophobia;  Suicidal Hematologic/Lymphatic Gastrointestinal Medical History: Past Medical History Notes: IBS-GERD Immunological Musculoskeletal Medical History: Positive for: Osteoarthritis - DDD Oncologic Medical History: Past Medical History Notes: Cervical Pre-Cancerous HBO Extended History Items Ear/Nose/Mouth/Throat: Chronic sinus problems/congestion Immunizations Pneumococcal Vaccine: Received Pneumococcal Vaccination: Yes Received Pneumococcal Vaccination On or After 60th Birthday: No Implantable Devices None Family and Social History Cancer: Yes - Maternal Grandparents; Diabetes: Yes - Maternal Grandparents; Heart Disease: Yes - Maternal Grandparents; Hereditary Spherocytosis: No; Hypertension: Yes - Maternal Grandparents; Seizures: No; Stroke: Yes - Paternal Grandparents; Thyroid Problems: No; Tuberculosis: No; Current every day smoker; Marital Status - Married; Alcohol Use: Never; Drug Use: No History; Caffeine Use: Moderate; Financial Concerns: No; Food, Clothing or Shelter Needs: No; Support System Lacking: No; Transportation Concerns: No Electronic Signature(s) Signed: 12/10/2020 5:22:07 PM By: Worthy Keeler PA-C Signed: 12/10/2020 5:27:26 PM By: Lorrin Jackson Entered By: Lorrin Jackson on 12/10/2020 09:33:21 -------------------------------------------------------------------------------- SuperBill Details Patient Name: Date of Service: Maureen Fix MA NDA J. 12/10/2020 Medical Record Number: [id number] Patient Account Number: [id number] Date of Birth/Sex: Treating RN: 03/16/1976 (45 y.o. [name] Primary Care Provider: PA Haig [place], [place] Other Clinician: Referring Provider: Treating Provider/Extender: Vickii Penna in Treatment: 0 Diagnosis Coding ICD-10 Codes Code Description [phone] Personal history of diseases of the skin and subcutaneous tissue Facility Procedures CPT4 Code: [id number] Description: [id number] - WOUND CARE VISIT-LEV 3 EST  PT Modifier: Quantity: 1 Physician Procedures : CPT4 Code Description Modifier [id number] - WC PHYS LEVEL 4 - NEW PT ICD-10 Diagnosis Description Z87.2 Personal history of diseases of the skin and subcutaneous tissue Quantity: 1 Electronic Signature(s) Signed: 12/10/2020 5:19:10 PM By: Worthy Keeler PA-C Entered By: Worthy Keeler on 12/10/2020 17:19:10

## 2020-12-10 NOTE — Progress Notes (Signed)
Maureen, Underwood ([id number]) Visit Report for 12/10/2020 Abuse/Suicide Risk Screen Details Patient Name: Date of Service: Maureen Fix MA NDA J. 12/10/2020 9:00 [place] Record Number: [id number] Patient Account Number: [id number] Date of Birth/Sex: Treating RN: Nov 26, 1975 (45 y.o. Maureen Underwood Primary Care Josefine Fuhr: PA Haig [place], [place] Other Clinician: Referring Jamarl Pew: Treating Janeya Deyo/Extender: Vickii Penna in Treatment: 0 Abuse/Suicide Risk Screen Items Answer ABUSE RISK SCREEN: Has anyone close to you tried to hurt or harm you recentlyo No Do you feel uncomfortable with anyone in your familyo No Has anyone forced you do things that you didnt want to doo No Electronic Signature(s) Signed: 12/10/2020 5:27:26 PM By: Lorrin Jackson Entered By: Lorrin Jackson on 12/10/2020 09:33:27 -------------------------------------------------------------------------------- Activities of Daily Living Details Patient Name: Date of Service: Maureen Fix MA NDA J. 12/10/2020 9:00 A M Medical Record Number: [id number] Patient Account Number: [id number] Date of Birth/Sex: Treating RN: Dec 14, 1975 (45 y.o. Maureen Underwood Primary Care Reylene Stauder: PA Haig [place], [place] Other Clinician: Referring Maureen Underwood: Treating Maureen Underwood/Extender: Vickii Penna in Treatment: 0 Activities of Daily Living Items Answer Activities of Daily Living (Please select one for each item) Drive Automobile Completely Able T Medications ake Completely Able Use T elephone Completely Able Care for Appearance Completely Able Use T oilet Completely Able Bath / Shower Completely Able Dress Self Completely Able Feed Self Completely Able Walk Completely Able Get In / Out Bed Completely Able Housework Completely Able Prepare Meals Completely Able Handle Money Completely Able Shop for Self Completely Able Electronic Signature(s) Signed: 12/10/2020 5:27:26 PM By: Lorrin Jackson Entered By: Lorrin Jackson on 12/10/2020 09:33:48 -------------------------------------------------------------------------------- Education Screening Details Patient Name: Date of Service: Maureen Fix MA NDA J. 12/10/2020 9:00 A M Medical Record Number: [id number] Patient Account Number: [id number] Date of Birth/Sex: Treating RN: July 04, 1975 (45 y.o. Maureen Underwood Primary Care Maureen Underwood: PA Haig [place], [place] Other Clinician: Referring Maureen Underwood: Treating Maureen Underwood/Extender: Vickii Penna in Treatment: 0 Primary Learner Assessed: Patient Learning Preferences/Education Level/Primary Language Learning Preference: Explanation, Demonstration, Printed Material Highest Education Level: College or Above Preferred Language: English Cognitive Barrier Language Barrier: No Translator Needed: No Memory Deficit: No Emotional Barrier: No Cultural/Religious Beliefs Affecting Medical Care: No Physical Barrier Impaired Vision: Yes Glasses Impaired Hearing: No Decreased Hand dexterity: No Knowledge/Comprehension Knowledge Level: High Comprehension Level: High Ability to understand written instructions: High Ability to understand verbal instructions: High Motivation Anxiety Level: Calm Cooperation: Cooperative Education Importance: Acknowledges Need Interest in Health Problems: Asks Questions Perception: Coherent Willingness to Engage in Self-Management High Activities: Readiness to Engage in Self-Management High Activities: Electronic Signature(s) Signed: 12/10/2020 5:27:26 PM By: Lorrin Jackson Entered By: Lorrin Jackson on 12/10/2020 09:34:20 -------------------------------------------------------------------------------- Fall Risk Assessment Details Patient Name: Date of Service: Maureen Fix MA NDA J. 12/10/2020 9:00 A M Medical Record Number: [id number] Patient Account Number: [id number] Date of Birth/Sex: Treating RN: Jan 05, 1976 (45 y.o. Maureen Underwood Primary Care Maureen Underwood: PA [place]ig [place],  [place] Other Clinician: Referring Maureen Underwood: Treating Maureen Underwood/Extender: Vickii Penna in Treatment: 0 Fall Risk Assessment Items Have you had 2 or more falls in the last 12 monthso 0 No Have you had any fall that resulted in injury in the last 12 monthso 0 No FALLS RISK SCREEN History of falling - immediate or within 3 months 0 No Secondary diagnosis (Do you have 2 or more medical diagnoseso) 0 No Ambulatory aid None/bed rest/wheelchair/nurse 0 Yes Crutches/cane/walker 0 No Furniture 0 No Intravenous  therapy Access/Saline/Heparin Lock 0 No Gait/Transferring Normal/ bed rest/ wheelchair 0 Yes Weak (short steps with or without shuffle, stooped but able to lift head while walking, may seek 0 No support from furniture) Impaired (short steps with shuffle, may have difficulty arising from chair, head down, impaired 0 No balance) Mental Status Oriented to own ability 0 Yes Electronic Signature(s) Signed: 12/10/2020 5:27:26 PM By: Lorrin Jackson Entered By: Lorrin Jackson on 12/10/2020 09:34:35 -------------------------------------------------------------------------------- Foot Assessment Details Patient Name: Date of Service: Maureen Fix MA NDA J. 12/10/2020 9:00 A M Medical Record Number: [id number] Patient Account Number: [id number] Date of Birth/Sex: Treating RN: 1975-12-28 (45 y.o. Maureen Underwood Primary Care Almond Fitzgibbon: PA [place], NO Other Clinician: Referring Maureen Underwood: Treating Maureen Underwood/Extender: Vickii Penna in Treatment: 0 Foot Assessment Items Site Locations + = Sensation present, - = Sensation absent, C = Callus, U = Ulcer R = Redness, W = Warmth, M = Maceration, PU = Pre-ulcerative lesion F = Fissure, S = Swelling, D = Dryness Assessment Right: Left: Other Deformity: No No Prior Foot Ulcer: No No Prior Amputation: No No Charcot Joint: No No Ambulatory Status: Ambulatory Without Help Gait: Steady Electronic  Signature(s) Signed: 12/10/2020 5:27:26 PM By: Lorrin Jackson Entered By: Lorrin Jackson on 12/10/2020 09:34:57 -------------------------------------------------------------------------------- Nutrition Risk Screening Details Patient Name: Date of Service: Maureen Fix MA NDA J. 12/10/2020 9:00 A M Medical Record Number: [id number] Patient Account Number: [id number] Date of Birth/Sex: Treating RN: 11-Dec-1975 (45 y.o. Maureen Underwood Primary Care Yeny Schmoll: PA [place], [place] Other Clinician: Referring Brayden Brodhead: Treating Damonie Furney/Extender: Vickii Penna in Treatment: 0 Height (in): 66 Weight (lbs): 210 Body Mass Index (BMI): 33.9 Nutrition Risk Screening Items Score Screening NUTRITION RISK SCREEN: I have an illness or condition that made me change the kind and/or amount of food I eat 0 No I eat fewer than two meals per day 0 No I eat few fruits and vegetables, or milk products 0 No I have three or more drinks of beer, liquor or wine almost every day 0 No I have tooth or mouth problems that make it hard for me to eat 0 No I don't always have enough money to buy the food I need 0 No I eat alone most of the time 0 No I take three or more different prescribed or over-the-counter drugs a day 0 No Without wanting to, I have lost or gained 10 pounds in the last six months 0 No I am not always physically able to shop, cook and/or feed myself 0 No Nutrition Protocols Good Risk Protocol 0 No interventions needed Moderate Risk Protocol High Risk Proctocol Risk Level: Good Risk Score: 0 Electronic Signature(s) Signed: 12/10/2020 5:27:26 PM By: Lorrin Jackson Entered By: Lorrin Jackson on 12/10/2020 09:34:44

## 2020-12-10 NOTE — Progress Notes (Signed)
Maureen, Underwood ([id number]) Visit Report for 12/10/2020 Allergy List Details Patient Name: Date of Service: Maureen Fix MA NDA J. 12/10/2020 9:00 [place] Record Number: [id number] Patient Account Number: [id number] Date of Birth/Sex: Treating RN: February 15, 1976 (45 y.o. Maureen Underwood Primary Care Everett Ricciardelli: PA [place], [place] Other Clinician: Referring Drelyn Pistilli: Treating Mallarie Voorhies/Extender: Vickii Penna in Treatment: 0 Allergies Active Allergies penicillin Reaction: Hives, Vomiting Severity: Moderate Allergy Notes Electronic Signature(s) Signed: 12/10/2020 5:27:26 PM By: Lorrin Jackson Entered By: Lorrin Jackson on 12/10/2020 09:28:02 -------------------------------------------------------------------------------- Arrival Information Details Patient Name: Date of Service: Maureen Fix MA NDA J. 12/10/2020 9:00 A M Medical Record Number: [id number] Patient Account Number: [id number] Date of Birth/Sex: Treating RN: September 05, 1975 (45 y.o. Maureen Underwood Primary Care Maureen Underwood: PA [place]ig [place], [place] Other Clinician: Referring Jerriyah Louis: Treating [name]/Extender: Vickii Penna in Treatment: 0 Visit Information Patient Arrived: Ambulatory Arrival Time: 09:22 Transfer Assistance: None Patient Identification Verified: Yes Secondary Verification Process Completed: Yes Patient Requires Transmission-Based Precautions: No Patient Has Alerts: No Electronic Signature(s) Signed: 12/10/2020 5:27:26 PM By: Lorrin Jackson Entered By: Lorrin Jackson on 12/10/2020 09:25:15 -------------------------------------------------------------------------------- Clinic Level of Care Assessment Details Patient Name: Date of Service: Maureen Fix MA NDA J. 12/10/2020 9:00 A M Medical Record Number: [id number] Patient Account Number: [id number] Date of Birth/Sex: Treating RN: [birth year] (45 y.o. Maureen Underwood Primary Care Nylee Barbuto: PA [place], [place] Other Clinician: Referring  Leaman Abe: Treating Maureen Underwood/Extender: Vickii Penna in Treatment: 0 Clinic Level of Care Assessment Items TOOL 2 Quantity Score '[]'$  - 0 Use when only an EandM is performed on the INITIAL visit ASSESSMENTS - Nursing Assessment / Reassessment X- 1 20 General Physical Exam (combine w/ comprehensive assessment (listed just below) when performed on new pt. evals) X- 1 25 Comprehensive Assessment (HX, ROS, Risk Assessments, Wounds Hx, etc.) ASSESSMENTS - Wound and Skin A ssessment / Reassessment '[]'$  - 0 Simple Wound Assessment / Reassessment - one wound '[]'$  - 0 Complex Wound Assessment / Reassessment - multiple wounds X- 1 10 Dermatologic / Skin Assessment (not related to wound area) ASSESSMENTS - Ostomy and/or Continence Assessment and Care '[]'$  - 0 Incontinence Assessment and Management '[]'$  - 0 Ostomy Care Assessment and Management (repouching, etc.) PROCESS - Coordination of Care '[]'$  - 0 Simple Patient / Family Education for ongoing care '[]'$  - 0 Complex (extensive) Patient / Family Education for ongoing care X- 1 10 Staff obtains Programmer, systems, Records, T Results / Process Orders est '[]'$  - 0 Staff telephones HHA, Nursing Homes / Clarify orders / etc '[]'$  - 0 Routine Transfer to another Facility (non-emergent condition) '[]'$  - 0 Routine Hospital Admission (non-emergent condition) X- 1 15 New Admissions / Biomedical engineer / Ordering NPWT Apligraf, etc. , '[]'$  - 0 Emergency Hospital Admission (emergent condition) X- 1 10 Simple Discharge Coordination '[]'$  - 0 Complex (extensive) Discharge Coordination PROCESS - Special Needs '[]'$  - 0 Pediatric / Minor Patient Management '[]'$  - 0 Isolation Patient Management '[]'$  - 0 Hearing / Language / Visual special needs '[]'$  - 0 Assessment of Community assistance (transportation, D/C planning, etc.) '[]'$  - 0 Additional assistance / Altered mentation '[]'$  - 0 Support Surface(s) Assessment (bed, cushion, seat, etc.) INTERVENTIONS -  Wound Cleansing / Measurement '[]'$  - 0 Wound Imaging (photographs - any number of wounds) '[]'$  - 0 Wound Tracing (instead of photographs) '[]'$  - 0 Simple Wound Measurement - one wound '[]'$  - 0 Complex Wound Measurement - multiple wounds '[]'$  - 0  Simple Wound Cleansing - one wound '[]'$  - 0 Complex Wound Cleansing - multiple wounds INTERVENTIONS - Wound Dressings '[]'$  - 0 Small Wound Dressing one or multiple wounds '[]'$  - 0 Medium Wound Dressing one or multiple wounds '[]'$  - 0 Large Wound Dressing one or multiple wounds '[]'$  - 0 Application of Medications - injection INTERVENTIONS - Miscellaneous '[]'$  - 0 External ear exam '[]'$  - 0 Specimen Collection (cultures, biopsies, blood, body fluids, etc.) '[]'$  - 0 Specimen(s) / Culture(s) sent or taken to Lab for analysis '[]'$  - 0 Patient Transfer (multiple staff / Harrel Lemon Lift / Similar devices) '[]'$  - 0 Simple Staple / Suture removal (25 or less) '[]'$  - 0 Complex Staple / Suture removal (26 or more) '[]'$  - 0 Hypo / Hyperglycemic Management (close monitor of Blood Glucose) '[]'$  - 0 Ankle / Brachial Index (ABI) - do not check if billed separately Has the patient been seen at the hospital within the last three years: Yes Total Score: 90 Level Of Care: New/Established - Level 3 Electronic Signature(s) Signed: 12/10/2020 5:57:23 PM By: Baruch Gouty RN, BSN Entered By: Baruch Gouty on 12/10/2020 10:19:13 -------------------------------------------------------------------------------- Encounter Discharge Information Details Patient Name: Date of Service: Maureen Fix MA NDA J. 12/10/2020 9:00 A M Medical Record Number: [id number] Patient Account Number: [id number] Date of Birth/Sex: Treating RN: Oct 21, 1975 (45 y.o. Maureen Underwood Primary Care Maureen Underwood: PA [place], [place] Other Clinician: Referring Zalen Sequeira: Treating Maureen Underwood/Extender: Vickii Penna in Treatment: 0 Encounter Discharge Information Items Discharge Condition: Stable Ambulatory  Status: Ambulatory Discharge Destination: Home Transportation: Private Auto Accompanied By: self Schedule Follow-up Appointment: Yes Clinical Summary of Care: Patient Declined Electronic Signature(s) Signed: 12/10/2020 5:57:23 PM By: Baruch Gouty RN, BSN Entered By: Baruch Gouty on 12/10/2020 17:36:20 -------------------------------------------------------------------------------- Lower Extremity Assessment Details Patient Name: Date of Service: Maureen Fix MA NDA J. 12/10/2020 9:00 A M Medical Record Number: [id number] Patient Account Number: [id number] Date of Birth/Sex: Treating RN: 12-16-1975 (45 y.o. Maureen Underwood Primary Care Davina Howlett: PA [place], [place] Other Clinician: Referring Jennaya Pogue: Treating Benedict Kue/Extender: Vickii Penna in Treatment: 0 Electronic Signature(s) Signed: 12/10/2020 5:27:26 PM By: Lorrin Jackson Entered By: Lorrin Jackson on 12/10/2020 09:42:31 -------------------------------------------------------------------------------- Pain Assessment Details Patient Name: Date of Service: Maureen Fix MA NDA J. 12/10/2020 9:00 A M Medical Record Number: [id number] Patient Account Number: [id number] Date of Birth/Sex: Treating RN: 1975/09/25 (45 y.o. Maureen Underwood Primary Care Malaia Buchta: PA [place], [place] Other Clinician: Referring Jeanni Allshouse: Treating Danzel Marszalek/Extender: Vickii Penna in Treatment: 0 Active Problems Location of Pain Severity and Description of Pain Patient Has Paino No Site Locations Pain Management and Medication Current Pain Management: Electronic Signature(s) Signed: 12/10/2020 5:27:26 PM By: Lorrin Jackson Entered By: Lorrin Jackson on 12/10/2020 09:42:08 -------------------------------------------------------------------------------- Patient/Caregiver Education Details Patient Name: Date of Service: Maureen Fix MA NDA J. 8/3/2022andnbsp9:00 A M Medical Record Number: [id number] Patient Account  Number: [id number] Date of Birth/Gender: Treating RN: 12-19-1975 (45 y.o. Maureen Underwood Primary Care Physician: PA [place]ig [place], [place] Other Clinician: Referring Physician: Treating Physician/Extender: Vickii Penna in Treatment: 0 Education Assessment Education Provided To: Patient Education Topics Provided Wound/Skin Impairment: Handouts: Caring for Your Ulcer, Skin Care Do's and Dont's Methods: Explain/Verbal, Printed Responses: Reinforcements needed, State content correctly Electronic Signature(s) Signed: 12/10/2020 5:57:23 PM By: Baruch Gouty RN, BSN Entered By: Baruch Gouty on 12/10/2020 10:04:58 -------------------------------------------------------------------------------- [place] Details Patient Name: Date of Service: Bonnye Fava, A MA NDA J. 12/10/2020 9:00 A  M Medical Record Number: [id number] Patient Account Number: [id number] Date of Birth/Sex: Treating RN: 02-09-1976 (45 y.o. Maureen Underwood Primary Care Anai Lipson: PA [place]ig [place], [place] Other Clinician: Referring Achaia Garlock: Treating Kalilah Barua/Extender: Vickii Penna in Treatment: 0 Vital Signs Time Taken: 09:25 Temperature (F): 98.4 Height (in): 66 Pulse (bpm): 80 Source: Stated Respiratory Rate (breaths/min): 16 Weight (lbs): 210 Blood Pressure (mmHg): 137/84 Source: Stated Reference Range: 80 - 120 mg / dl Body Mass Index (BMI): 33.9 Electronic Signature(s) Signed: 12/10/2020 5:27:26 PM By: Lorrin Jackson Entered By: Lorrin Jackson on 12/10/2020 09:27:36

## 2021-06-08 ENCOUNTER — Emergency Department (HOSPITAL_COMMUNITY)
Admission: EM | Admit: 2021-06-08 | Discharge: 2021-06-09 | Disposition: A | Discharge status: Home / Self Care | Payer: Medicaid Other | Attending: Emergency Medicine | Admitting: Emergency Medicine

## 2021-06-08 ENCOUNTER — Encounter (HOSPITAL_COMMUNITY): Payer: Self-pay | Admitting: *Deleted

## 2021-06-08 ENCOUNTER — Other Ambulatory Visit: Payer: Self-pay

## 2021-06-08 DIAGNOSIS — H0015 Chalazion left lower eyelid: Secondary | ICD-10-CM | POA: Insufficient documentation

## 2021-06-08 DIAGNOSIS — H11422 Conjunctival edema, left eye: Secondary | ICD-10-CM | POA: Insufficient documentation

## 2021-06-08 MED ORDER — TETRACAINE HCL 0.5 % OP SOLN
1.0000 [drp] | Freq: Once | OPHTHALMIC | Status: AC
  Administered 2021-06-08: 1 [drp] via OPHTHALMIC
  Filled 2021-06-08: qty 4

## 2021-06-08 MED ORDER — DOXYCYCLINE HYCLATE 100 MG PO TABS
100.0000 mg | ORAL_TABLET | Freq: Once | ORAL | Status: AC
  Administered 2021-06-09: 100 mg via ORAL
  Filled 2021-06-08: qty 1

## 2021-06-08 MED ORDER — DOXYCYCLINE HYCLATE 100 MG PO CAPS: 100.0000 mg | ORAL_CAPSULE | Freq: Two times a day (BID) | ORAL | 0 refills | Status: AC

## 2021-06-08 MED ORDER — OXYCODONE-ACETAMINOPHEN 5-325 MG PO TABS: 1.0000 | ORAL_TABLET | Freq: Four times a day (QID) | ORAL | 0 refills | Status: AC | PRN

## 2021-06-08 MED ORDER — OXYCODONE-ACETAMINOPHEN 5-325 MG PO TABS
1.0000 | ORAL_TABLET | Freq: Once | ORAL | Status: AC
  Administered 2021-06-09: 1 via ORAL
  Filled 2021-06-08: qty 1

## 2021-06-08 MED ORDER — FLUORESCEIN SODIUM 1 MG OP STRP
1.0000 | ORAL_STRIP | Freq: Once | OPHTHALMIC | Status: AC
  Administered 2021-06-08: 1 via OPHTHALMIC
  Filled 2021-06-08: qty 1

## 2021-06-08 NOTE — ED Triage Notes (Signed)
Pt states she was washing her face this morning and it felt like she "had glass" in her left eye. Swelling, redness with mucous drainage. Also wants abscess on her chin evaluated, she noticed it about 1.5 weeks ago, has been draining.

## 2021-06-08 NOTE — ED Provider Notes (Signed)
Maureen Underwood   CSN: [id number] Arrival date & time: 06/08/21  2045     History  Chief Complaint  Patient presents with   Eye Problem    Maureen Underwood is a 46 y.o. female who presents to the ED for evaluation of left eye pain.  Patient states she woke up and during her shower, while she was washing her face she suddenly developed the sensation of glass being thrown into her eye.  She immediately started trying to flush her eye with water, saline drops and eyedrops but continue to have the sensation of foreign body described as glass.  She has been rubbing her eyes significantly throughout the day in order to relieve that foreign body sensation.  She has now rubbed out most of her eyelashes.  At this point, her eye lids are swollen shut with some discharge, although she can open with some effort.  Vision is intact.  Patient states pain is worse on the lateral upper and lower eyelid. She denies fever, chills, headache shortness of breath.   Eye Problem Associated symptoms: discharge, photophobia and redness   Associated symptoms: no headaches and no vomiting       Home Medications Prior to Admission medications   Medication Sig Start Date End Date Taking? Authorizing Provider  doxycycline (VIBRAMYCIN) 100 MG capsule Take 1 capsule (100 mg total) by mouth 2 (two) times daily. 11/21/20   Jaynee Eagles, PA-C  naproxen (NAPROSYN) 500 MG tablet Take 1 tablet (500 mg total) by mouth 2 (two) times daily with a meal. 11/21/20   Jaynee Eagles, PA-C  ondansetron (ZOFRAN) 8 MG tablet Take by mouth every 8 (eight) hours as needed for nausea or vomiting.    [provider]      Allergies    Penicillins    Review of Systems   Review of Systems  Constitutional:  Negative for fever.  HENT: Negative.    Eyes:  Positive for photophobia, pain, discharge and redness.  Respiratory:  Negative for shortness of breath.   Cardiovascular: Negative.    Gastrointestinal:  Negative for abdominal pain and vomiting.  Endocrine: Negative.   Genitourinary: Negative.   Musculoskeletal: Negative.   Skin:  Negative for rash.  Neurological:  Negative for headaches.  All other systems reviewed and are negative.  Physical Exam Updated Vital Signs BP (!) 134/99 (BP Location: Right Arm)    Pulse 88    Temp 97.9 F (36.6 C) (Oral)    Resp 16    Ht 5\' 6"  (1.676 m)    Wt 86.2 kg    SpO2 96%    BMI 30.67 kg/m  Physical Exam Vitals and nursing Underwood reviewed.  Constitutional:      General: She is not in acute distress.    Appearance: She is not ill-appearing.  HENT:     Head: Atraumatic.  Eyes:     Extraocular Movements: Extraocular movements intact.     Conjunctiva/sclera: Conjunctivae normal.     Comments: Left upper and lower eyelid with significant swelling and erythema.  Chemosis of the left conjunctiva.  Iris normal.  There appears to be a small chalazion on the internal left lower eyelid, however given how significant the inflammation is it is hard to assess fully.  EOM intact.  Fluorescein stain negative for abrasions.  Cardiovascular:     Rate and Rhythm: Normal rate and regular rhythm.     Pulses: Normal pulses.     Heart sounds:  No murmur heard. Pulmonary:     Effort: Pulmonary effort is normal. No respiratory distress.     Breath sounds: Normal breath sounds.  Abdominal:     General: Abdomen is flat. There is no distension.     Palpations: Abdomen is soft.     Tenderness: There is no abdominal tenderness.  Musculoskeletal:        General: Normal range of motion.     Cervical back: Normal range of motion.  Skin:    General: Skin is warm and dry.     Capillary Refill: Capillary refill takes less than 2 seconds.  Neurological:     General: No focal deficit present.     Mental Status: She is alert.  Psychiatric:        Mood and Affect: Mood normal.    ED Results / Procedures / Treatments   Labs (all labs ordered are listed,  but only abnormal results are displayed) Labs Reviewed - No data to display  EKG None  Radiology No results found.  Procedures Procedures    Medications Ordered in ED Medications  fluorescein ophthalmic strip 1 strip (1 strip Both Eyes Given by Other 06/08/21 2315)  tetracaine (PONTOCAINE) 0.5 % ophthalmic solution 1 drop (1 drop Both Eyes Given by Other 06/08/21 2315)    ED Course/ Medical Decision Making/ A&P                           Medical Decision Making Risk Prescription drug management.   History:   Maureen Underwood is a 46 y.o. female who presents to the ED for evaluation of left eye pain.  Patient states she woke up and during her shower, while she was washing her face she suddenly developed the sensation of glass being thrown into her eye.  She immediately started trying to flush her eye with water, saline drops and eyedrops but continue to have the sensation of foreign body described as glass.  She has been rubbing her eyes significantly throughout the day in order to relieve that foreign body sensation.  She has now rubbed out most of her eyelashes.  At this point, her eye lids are swollen shut with some discharge, although she can open with some effort.  Vision is intact.  Patient states pain is worse on the lateral upper and lower eyelid. She denies fever, chills, headache shortness of breath. This patient presents to the ED for concern of eye pain, this involves an extensive number of treatment options, and is a complaint that carries with it a high risk of complications and morbidity.   The emergent differential diagnosis for acute eye pain includes, but is not limited to ocular ischemia, optic neuritis, temporal arteritis, Sinusitis, neuralgia, Migraine, Acute angle closure glaucoma, eye trauma, uveitis, iritis, corneal abrasion/ulceration, and photokeratitis.  Initial impression:  Left eye is swollen shut.  I was able to pry it open to inspect the eye.  Significant  chemosis.  EOM intact.  She is photophobic.  She has tenderness palpation of the lateral left upper and lower eyelid although pain is worse on the lower eyelid.  Fluorescein stain negative for abrasion.  Low concern for iritis, scleritis.  Does seem to be a small chalazion on the left lower eyelid although the eyelid is incredibly inflamed and it is difficult to fully assess.  I had Dr. Francia Greaves also inspect the eye out of concern for possible orbital cellulitis and he agrees that patient's  erythema and tenderness are most likely from her rubbing her eye all day.  She is missing almost all of her eyelashes of the left eye.  I will consult with ophthalmology to have patient seen tomorrow.  Lab Tests and EKG:  I Ordered, reviewed, and interpreted labs and EKG.  The pertinent results in my decision-making regarding them are detailed in the ED course and/or initial impression section above.  Medicines ordered and prescription drug management:  I ordered medication including: Doxycycline 100 mg for infection Percocet for pain Reevaluation of the patient after these medicines showed that the patient improved I have reviewed the patients home medicines and have made adjustments as needed   Consultations Obtained:  I requested consultation with Dr. Eulas Post in ophthalmology,  and discussed lab and imaging findings as well as pertinent plan - they recommend: Start patient on doxycycline tonight.  She can follow-up with them at his clinic tomorrow at 115p when he is done with surgeries.   Disposition:  After consideration of the diagnostic results, physical exam, history and the patients response to treatment feel that the patent would benefit from discharge with outpatient follow-up and strict return precautions.   Chemosis left eye Chalazion left eye: Concern is low for orbital cellulitis given normal EOM without pain.  Similarly less inclined towards scleritis.  First dose of antibiotics given here in  ED along with pain medication.  Appointment set for 1:15 PM tomorrow at Dr. Olin Pia office.  Strict return precautions were discussed and patient expresses understanding.  She is amenable to plan and discharged home in good condition.   Final Clinical Impression(s) / ED Diagnoses Final diagnoses:  Chemosis of conjunctiva, left  Chalazion left lower eyelid    Rx / DC Orders ED Discharge Orders          Ordered    doxycycline (VIBRAMYCIN) 100 MG capsule  2 times daily        06/08/21 2349    oxyCODONE-acetaminophen (PERCOCET/ROXICET) 5-325 MG tablet  Every 6 hours PRN        06/08/21 2349              Tonye Pearson, PA-C 06/09/21 0009    Valarie Merino, MD 06/10/21 1145

## 2021-06-08 NOTE — Discharge Instructions (Signed)
Your eyelid appears to have something called a chalazion of the left lower eyelid.  We are not totally sure what to make of the sudden glass sensation in your eye, but we think that the constant rubbing throughout the day is significantly worsened the tenderness and pain.  We contacted our ophthalmologist on-call who recommends we start you on doxycycline to help with any infection.  I have also sent in a couple pain medications until you can see him tomorrow.  I hope you feel better soon.  If your pain continues to worsen, you develop fevers, or you are having significant pain when moving your eye, please return to the ED for evaluation.

## 2022-06-17 ENCOUNTER — Encounter (INDEPENDENT_AMBULATORY_CARE_PROVIDER_SITE_OTHER): Payer: Self-pay

## 2024-01-31 ENCOUNTER — Ambulatory Visit: Admitting: Gastroenterology

## 2024-05-24 ENCOUNTER — Telehealth: Admitting: Physician Assistant

## 2024-05-24 DIAGNOSIS — L089 Local infection of the skin and subcutaneous tissue, unspecified: Secondary | ICD-10-CM

## 2024-05-24 NOTE — Progress Notes (Signed)
 We are going to encourage you and recommend that you be seen in person given your symptoms this does not appear to be a scabies issue.  In addition to losing your lashes something is causing you to have reactions.  This could be medication, stress, something else going on in your body.  But this is something that has to be addressed in person for safe assessment and treatment.   Please follow-up at a local urgent care close to you.  Or possibly even your primary care if they are available.   NOTE: There will be NO CHARGE for this E-Visit

## 2024-06-13 ENCOUNTER — Inpatient Hospital Stay: Admission: RE | Admit: 2024-06-13 | Discharge: 2024-06-13

## 2024-06-13 VITALS — BP 131/85 | HR 92 | Temp 98.5°F | Resp 18 | Wt 223.0 lb

## 2024-06-13 DIAGNOSIS — L989 Disorder of the skin and subcutaneous tissue, unspecified: Secondary | ICD-10-CM

## 2024-06-13 MED ORDER — DOXYCYCLINE HYCLATE 100 MG PO CAPS: 100.0000 mg | ORAL_CAPSULE | Freq: Two times a day (BID) | ORAL | 0 refills | Status: AC

## 2024-06-13 NOTE — ED Triage Notes (Signed)
 Pt states she has bumps on bilateral elbows down to her wrist and legs x 9 months. Pt states she was sleeping outside in a tent for 1 year and had several tick bites. She thought the bumps were coming from the ticks. She has been applying antibiotic ointment to the area.  She has left side neck swelling x 3 days.

## 2024-06-13 NOTE — ED Provider Notes (Addendum)
 " [facility]    CSN: [id number] Arrival date & time: 06/13/24  1001      History   Chief Complaint Chief Complaint  Patient presents with   Follow-up    Sores on forearms & left leg. legs and feet swelling dispite elevating. Left side of neck from ear to collar bone has large lump tender and swollen. Sores on hair line. - Entered by patient   bumps     HPI Maureen Underwood is a 49 y.o. female presenting with skin lesions and leg swelling. History of sepsis, abscesses and cellulitis in the past. She first attempted an e-visit for these symptoms, but was advised to be seen in-person.  Pt states she has bumps on bilateral elbows down to her wrist and legs x 9 months. They are getting worse. Pruritic.  Feels a bit like past cellulitis.. Pt states she was sleeping outside in a tent for 1 year and had several tick bites. She thought the bumps were coming from the ticks. She has been applying antibiotic ointment to the area. Denies IV drug use.    She has left side neck swelling x 3 days, close to the site of a scalp lesion.  Leg and feet swelling despite elevation. She does spend time on her feet throughout the day.  Endorses fatigue Denies shortness of breath, chest pain  HPI  Past Medical History:  Diagnosis Date   Anxiety    Degenerative disc disease, lumbar    Diarrhea    Dyspepsia    Fatty liver    GERD (gastroesophageal reflux disease)    IBS (irritable bowel syndrome)    Personality disorder (HCC)    Polycystic ovarian disease    Scoliosis    with associated back pain    Patient Active Problem List   Diagnosis Date Noted   Sepsis (HCC) 02/13/2020   Cellulitis of right axilla 02/13/2020   History of colonic polyps 12/08/2016   Idiopathic scoliosis 11/04/2015   Chronic radicular pain of lower back 11/04/2015   Tobacco abuse 11/04/2015   Irritable bowel syndrome with diarrhea 03/27/2015   HLD (hyperlipidemia) 12/06/2014   HSIL (high grade squamous  intraepithelial lesion) on Pap smear of cervix 12/06/2014   Anxiety 11/27/2014   Depression 11/27/2014    Past Surgical History:  Procedure Laterality Date   CERVICAL BIOPSY  W/ LOOP ELECTRODE EXCISION     CHOLECYSTECTOMY     DILATION AND CURETTAGE OF UTERUS     2005   INCISION AND DRAINAGE ABSCESS Right 02/13/2020   Procedure: INCISION AND DRAINAGE ABSCESS;  Surgeon: Jordis Laneta FALCON, MD;  Location: [facility];  Service: General;  Laterality: Right;   INCISION AND DRAINAGE PERIRECTAL ABSCESS      OB History     Gravida  0   Para  0   Term  0   Preterm  0   AB  0   Living  0      SAB  0   IAB  0   Ectopic  0   Multiple  0   Live Births               Home Medications    Prior to Admission medications  Medication Sig Start Date End Date Taking? Authorizing Provider  doxycycline  (VIBRAMYCIN ) 100 MG capsule Take 1 capsule (100 mg total) by mouth 2 (two) times daily for 7 days. 06/13/24 06/20/24 Yes Raiven Belizaire E, PA-C  naproxen  (NAPROSYN ) 500 MG tablet Take  1 tablet (500 mg total) by mouth 2 (two) times daily with a meal. 11/21/20   Christopher Savannah, PA-C  ondansetron  (ZOFRAN ) 8 MG tablet Take by mouth every 8 (eight) hours as needed for nausea or vomiting.    [provider]  oxyCODONE -acetaminophen  (PERCOCET/ROXICET) 5-325 MG tablet Take 1 tablet by mouth every 6 (six) hours as needed for severe pain. 06/08/21   Patti Geni SAUNDERS, PA-C    Family History Family History  Problem Relation Age of Onset   Diabetes Mother    Diabetes Maternal Grandmother    Hyperlipidemia Maternal Grandmother    Heart disease Maternal Grandfather    Hypertension Maternal Grandfather    Lung cancer Maternal Grandfather    Pancreatic cancer Maternal Grandfather    Colon cancer Neg Hx    Esophageal cancer Neg Hx    Stomach cancer Neg Hx    Liver disease Neg Hx     Social History Social History[1]   Allergies   Penicillins   Review of Systems Review of Systems   Musculoskeletal:        Pedal edema  Skin:  Positive for rash and wound.     Physical Exam Triage Vital Signs ED Triage Vitals  Encounter Vitals Group     BP      Girls Systolic BP Percentile      Girls Diastolic BP Percentile      Boys Systolic BP Percentile      Boys Diastolic BP Percentile      Pulse      Resp      Temp      Temp src      SpO2      Weight      Height      Head Circumference      Peak Flow      Pain Score      Pain Loc      Pain Education      Exclude from Growth Chart    No data found.  Updated Vital Signs BP 131/85 (BP Location: Left Arm)   Pulse 92   Temp 98.5 F (36.9 C) (Oral)   Resp 18   Wt 223 lb (101.2 kg)   LMP 06/04/2024   SpO2 95%   BMI 35.99 kg/m   Visual Acuity Right Eye Distance:   Left Eye Distance:   Bilateral Distance:    Right Eye Near:   Left Eye Near:    Bilateral Near:     Physical Exam Vitals reviewed.  Constitutional:      General: She is not in acute distress.    Appearance: Normal appearance. She is not ill-appearing or diaphoretic.  HENT:     Head: Normocephalic and atraumatic.  Neck:     Comments: Left sided cervical lymphadenopathy along cervical chain that is distal to the scalp lesion.  No palpable thyromegaly. Cardiovascular:     Rate and Rhythm: Normal rate and regular rhythm.     Heart sounds: Normal heart sounds.  Pulmonary:     Effort: Pulmonary effort is normal.     Breath sounds: Normal breath sounds. No wheezing, rhonchi or rales.  Musculoskeletal:     Comments: 1+ lower extremity edema extending from the ankles through the calves.  No tenderness.  No pitting edema.  Lymphadenopathy:     Cervical: Cervical adenopathy present.     Left cervical: Superficial cervical adenopathy, deep cervical adenopathy and posterior cervical adenopathy present.  Skin:    General:  Skin is warm.     Comments: See images below  forearms (elbows through hands), scalp, and lower extremities (knees through  ankles) with excoriation and scabbing, in various stages of healing.  Tender to palpation.  No warmth, induration, or fluctuance.  Neurological:     General: No focal deficit present.     Mental Status: She is alert and oriented to person, place, and time.  Psychiatric:        Mood and Affect: Mood normal.        Behavior: Behavior normal.        Thought Content: Thought content normal.        Judgment: Judgment normal.          UC Treatments / Results  Labs (all labs ordered are listed, but only abnormal results are displayed) Labs Reviewed  COMPREHENSIVE METABOLIC PANEL WITH GFR    EKG   Radiology No results found.  Procedures Procedures (including critical care time)  Medications Ordered in UC Medications - No data to display  Initial Impression / Assessment and Plan / UC Course  I have reviewed the triage vital signs and the nursing notes.  Pertinent labs & imaging results that were available during my care of the patient were reviewed by me and considered in my medical decision making (see chart for details).     Patient is a pleasant 49 y.o. female presenting with skin lesions. The patient is afebrile and nontachycardic.  Antipyretic has not been administered today.  No documented history of kidney disease.  Last BMP on file is from 02/13/2020.  Creatinine clearance estimated to be 121 mL/minute, based on 2021 BMP.  Will cover for MRSA with doxycycline .  Dermatology referral sent.   Her lower extremities have 1+ pedal edema, which could be dependent edema.  We are checking liver and kidney function today. She does not have shortness of breath or chest pain, and lungs are without rales, so I have low suspicion for heart failure.   We assisted her in scheduling an appointment with a new primary care doctor.  Strict ER return precautions as below.  Final Clinical Impressions(s) / UC Diagnoses   Final diagnoses:  Skin lesions     Discharge Instructions       -Doxycycline  twice daily for 7 days.  Make sure to wear sunscreen while spending time outside while on this medication as it can increase your chance of sunburn. You can take this medication with food if you have a sensitive stomach. -Neck swelling is most likely lymph nodes due to skin infection -We are checking your kidney and liver function today. -I have referred you to a dermatologist (see below). If they don't call within 1 week, call them to schedule. -Please establish care with primary care doctor -If new or changing symptoms: worsening leg swelling, new chest pain/ shortness of breath, new fevers, rash getting worse despite treatment - go to the ER.     ED Prescriptions     Medication Sig Dispense Auth. Provider   doxycycline  (VIBRAMYCIN ) 100 MG capsule Take 1 capsule (100 mg total) by mouth 2 (two) times daily for 7 days. 14 capsule Coltyn Hanning E, PA-C      PDMP not reviewed this encounter.    Arlyss Leita BRAVO, PA-C 06/13/24 1051    Arlyss Leita BRAVO, PA-C 06/13/24 1055     [1]  Social History Tobacco Use   Smoking status: Every Day    Current packs/day: 0.25  Average packs/day: 0.3 packs/day for 27.4 years (6.9 ttl pk-yrs)    Types: Cigarettes    Start date: 01/06/1997   Smokeless tobacco: Never   Tobacco comments:    smoked since 20; form given 03-01-16  Vaping Use   Vaping status: Never Used  Substance Use Topics   Alcohol use: No    Alcohol/week: 0.0 standard drinks of alcohol   Drug use: No     Arlyss Leita BRAVO, PA-C 06/13/24 1057  "

## 2024-06-13 NOTE — Discharge Instructions (Addendum)
-  Doxycycline  twice daily for 7 days.  Make sure to wear sunscreen while spending time outside while on this medication as it can increase your chance of sunburn. You can take this medication with food if you have a sensitive stomach. -Neck swelling is most likely lymph nodes due to skin infection -We are checking your kidney and liver function today. -I have referred you to a dermatologist (see below). If they don't call within 1 week, call them to schedule. -Please establish care with primary care doctor -If new or changing symptoms: worsening leg swelling, new chest pain/ shortness of breath, new fevers, rash getting worse despite treatment - go to the ER.

## 2024-06-14 ENCOUNTER — Ambulatory Visit (HOSPITAL_COMMUNITY): Payer: Self-pay

## 2024-06-14 LAB — COMPREHENSIVE METABOLIC PANEL WITH GFR
ALT: 18 [IU]/L (ref 0–32)
AST: 16 [IU]/L (ref 0–40)
Albumin: 4 g/dL (ref 3.9–4.9)
Alkaline Phosphatase: 100 [IU]/L (ref 41–116)
BUN/Creatinine Ratio: 13 (ref 9–23)
BUN: 12 mg/dL (ref 6–24)
Bilirubin Total: 0.5 mg/dL (ref 0.0–1.2)
CO2: 22 mmol/L (ref 20–29)
Calcium: 9.4 mg/dL (ref 8.7–10.2)
Chloride: 102 mmol/L (ref 96–106)
Creatinine, Ser: 0.93 mg/dL (ref 0.57–1.00)
Globulin, Total: 3.5 g/dL (ref 1.5–4.5)
Glucose: 90 mg/dL (ref 70–99)
Potassium: 4.1 mmol/L (ref 3.5–5.2)
Sodium: 140 mmol/L (ref 134–144)
Total Protein: 7.5 g/dL (ref 6.0–8.5)
eGFR: 77 mL/min/{1.73_m2}

## 2024-06-18 ENCOUNTER — Ambulatory Visit: Admitting: Sports Medicine

## 2024-07-26 ENCOUNTER — Encounter: Admitting: Obstetrics & Gynecology
